# Patient Record
Sex: Male | Born: 1975 | Race: White | Hispanic: No | Marital: Married | Smoking: Current every day smoker
Health system: Southern US, Community
[De-identification: ages and names within clinical notes are randomized; demographics above are authoritative.]

## PROBLEM LIST (undated history)

## (undated) DIAGNOSIS — F419 Anxiety disorder, unspecified: Secondary | ICD-10-CM

## (undated) DIAGNOSIS — I10 Essential (primary) hypertension: Secondary | ICD-10-CM

## (undated) DIAGNOSIS — F32A Depression, unspecified: Secondary | ICD-10-CM

## (undated) DIAGNOSIS — I219 Acute myocardial infarction, unspecified: Secondary | ICD-10-CM

## (undated) DIAGNOSIS — F329 Major depressive disorder, single episode, unspecified: Secondary | ICD-10-CM

## (undated) HISTORY — DX: Anxiety disorder, unspecified: F41.9

## (undated) HISTORY — DX: Depression, unspecified: F32.A

## (undated) HISTORY — DX: Essential (primary) hypertension: I10

## (undated) HISTORY — DX: Major depressive disorder, single episode, unspecified: F32.9

## (undated) HISTORY — PX: OTHER SURGICAL HISTORY: SHX169

---

## 2001-03-22 ENCOUNTER — Emergency Department (HOSPITAL_COMMUNITY): Admission: EM | Admit: 2001-03-22 | Discharge: 2001-03-23 | Payer: Self-pay | Admitting: Emergency Medicine

## 2001-03-23 ENCOUNTER — Encounter: Payer: Self-pay | Admitting: Emergency Medicine

## 2001-03-29 ENCOUNTER — Emergency Department (HOSPITAL_COMMUNITY): Admission: EM | Admit: 2001-03-29 | Discharge: 2001-03-29 | Payer: Self-pay | Admitting: Emergency Medicine

## 2001-10-16 ENCOUNTER — Encounter: Payer: Self-pay | Admitting: Emergency Medicine

## 2001-10-16 ENCOUNTER — Emergency Department (HOSPITAL_COMMUNITY): Admission: EM | Admit: 2001-10-16 | Discharge: 2001-10-16 | Payer: Self-pay | Admitting: Emergency Medicine

## 2001-12-16 ENCOUNTER — Emergency Department (HOSPITAL_COMMUNITY): Admission: EM | Admit: 2001-12-16 | Discharge: 2001-12-16 | Payer: Self-pay | Admitting: Emergency Medicine

## 2001-12-16 ENCOUNTER — Encounter: Payer: Self-pay | Admitting: Emergency Medicine

## 2008-08-16 ENCOUNTER — Ambulatory Visit: Payer: Self-pay | Admitting: Diagnostic Radiology

## 2008-08-16 ENCOUNTER — Emergency Department (HOSPITAL_BASED_OUTPATIENT_CLINIC_OR_DEPARTMENT_OTHER): Admission: EM | Admit: 2008-08-16 | Discharge: 2008-08-16 | Payer: Self-pay | Admitting: Emergency Medicine

## 2008-12-07 ENCOUNTER — Emergency Department (HOSPITAL_COMMUNITY): Admission: EM | Admit: 2008-12-07 | Discharge: 2008-12-07 | Payer: Self-pay | Admitting: Internal Medicine

## 2009-04-08 ENCOUNTER — Emergency Department (HOSPITAL_COMMUNITY): Admission: EM | Admit: 2009-04-08 | Discharge: 2009-04-08 | Payer: Self-pay | Admitting: Emergency Medicine

## 2009-05-01 ENCOUNTER — Emergency Department (HOSPITAL_COMMUNITY): Admission: EM | Admit: 2009-05-01 | Discharge: 2009-05-01 | Payer: Self-pay | Admitting: Emergency Medicine

## 2009-07-26 ENCOUNTER — Emergency Department: Payer: Self-pay

## 2010-07-22 LAB — URINALYSIS, ROUTINE W REFLEX MICROSCOPIC
Bilirubin Urine: NEGATIVE
Glucose, UA: NEGATIVE mg/dL
Ketones, ur: NEGATIVE mg/dL
Protein, ur: NEGATIVE mg/dL

## 2010-07-22 LAB — RAPID URINE DRUG SCREEN, HOSP PERFORMED
Amphetamines: NOT DETECTED
Barbiturates: NOT DETECTED
Benzodiazepines: POSITIVE — AB
Cocaine: NOT DETECTED
Opiates: POSITIVE — AB

## 2010-07-22 LAB — BASIC METABOLIC PANEL
BUN: 8 mg/dL (ref 6–23)
Calcium: 9.3 mg/dL (ref 8.4–10.5)
GFR calc non Af Amer: >60 mL/min (ref >60)
Glucose, Bld: 160 mg/dL — ABNORMAL HIGH (ref 70–99)
Potassium: 3.2 mEq/L — ABNORMAL LOW (ref 3.5–5.1)

## 2010-07-22 LAB — CBC
HCT: 41.4 % (ref 39.0–52.0)
Platelets: 227 10*3/uL (ref 150–400)
RDW: 13.5 % (ref 11.5–15.5)

## 2010-07-22 LAB — DIFFERENTIAL
Basophils Absolute: 0 10*3/uL (ref 0.0–0.1)
Eosinophils Relative: 0 % (ref 0–5)
Lymphocytes Relative: 8 % — ABNORMAL LOW (ref 12–46)

## 2011-03-06 ENCOUNTER — Encounter: Payer: Self-pay | Admitting: Emergency Medicine

## 2011-03-06 ENCOUNTER — Emergency Department (HOSPITAL_COMMUNITY): Payer: Self-pay

## 2011-03-06 ENCOUNTER — Emergency Department (HOSPITAL_COMMUNITY)
Admission: EM | Admit: 2011-03-06 | Discharge: 2011-03-06 | Disposition: A | Discharge status: Home / Self Care | Payer: Self-pay | Attending: Emergency Medicine | Admitting: Emergency Medicine

## 2011-03-06 DIAGNOSIS — F17200 Nicotine dependence, unspecified, uncomplicated: Secondary | ICD-10-CM | POA: Insufficient documentation

## 2011-03-06 DIAGNOSIS — R238 Other skin changes: Secondary | ICD-10-CM | POA: Insufficient documentation

## 2011-03-06 DIAGNOSIS — M25569 Pain in unspecified knee: Secondary | ICD-10-CM | POA: Insufficient documentation

## 2011-03-06 MED ORDER — HYDROCODONE-ACETAMINOPHEN 5-325 MG PO TABS: 1.0000 | ORAL_TABLET | Freq: Four times a day (QID) | ORAL | Status: DC | PRN

## 2011-03-06 MED ORDER — HYDROCODONE-ACETAMINOPHEN 5-325 MG PO TABS
1.0000 | ORAL_TABLET | Freq: Once | ORAL | Status: AC
  Administered 2011-03-06: 1 via ORAL
  Filled 2011-03-06: qty 1

## 2011-03-06 MED ORDER — DICLOFENAC SODIUM 75 MG PO TBEC: 75.0000 mg | DELAYED_RELEASE_TABLET | Freq: Two times a day (BID) | ORAL | Status: DC

## 2011-03-06 NOTE — ED Provider Notes (Signed)
History     CSN: [id number] Arrival date & time: 03/06/2011  5:41 PM   First MD Initiated Contact with Patient 03/06/11 1757     6:35 PM HPI Patient is complaining of left knee pain that began 2 weeks ago. No known injury or prior trauma determined. Reports having bruising and a circular area around the left medial inferior knee tender to palpation. Tender to bending, and walking. No edema, erythema,numbness, tingling or weakness.  Patient is a 35 y.o. male presenting with knee pain.  Knee Pain This is a new problem. Episode onset: 2 weeks ago. The problem occurs constantly. The problem has been gradually worsening. Pertinent negatives include no chills, fever, joint swelling, numbness or weakness. The symptoms are aggravated by walking, standing and bending. He has tried nothing for the symptoms.    History reviewed. No pertinent past medical history.  History reviewed. No pertinent past surgical history.  No family history on file.  History  Substance Use Topics  . Smoking status: Current Everyday Smoker -- 1.0 packs/day  . Smokeless tobacco: Never Used  . Alcohol Use: No      Review of Systems  Constitutional: Negative for fever and chills.  Musculoskeletal: Negative for back pain and joint swelling.       Positive for knee pain  Skin: Positive for color change.  Neurological: Negative for weakness and numbness.  All other systems reviewed and are negative.    Allergies  Review of patient's allergies indicates no known allergies.  Home Medications   Current Outpatient Rx  Name Route Sig Dispense Refill  . IBUPROFEN 200 MG PO TABS Oral Take 600 mg by mouth every 6 (six) hours as needed. Pain.     . ONE-A-DAY MENS PO Oral Take 1 tablet by mouth daily.      Marland Kitchen NAPHAZOLINE HCL 0.012 % OP SOLN Both Eyes Place 1 drop into both eyes 4 (four) times daily. Flush eyes out       BP 145/94  Pulse 102  Temp(Src) 98 F (36.7 C) (Oral)  Resp 20  SpO2 100%  Physical Exam    Constitutional: He is oriented to person, place, and time. He appears well-developed and well-nourished.  HENT:  Head: Normocephalic and atraumatic.  Eyes: Pupils are equal, round, and reactive to light.  Musculoskeletal:       Left knee: He exhibits normal range of motion, no swelling, no erythema and no bony tenderness. tenderness found. Medial joint line tenderness noted.       Is ecchymosis of the medial left knee. Appears to be a bite wound however patient denies having a bite wound. Tender to palpation. Full range of motion, strength, sensation distally. Normal pulses distally.  Neurological: He is alert and oriented to person, place, and time.  Skin: Skin is warm and dry. No rash noted. No erythema. No pallor.  Psychiatric: He has a normal mood and affect. His behavior is normal.    ED Course  Procedures  MDM          Thomasene Lot, [place] 03/07/11 0114

## 2011-03-06 NOTE — ED Notes (Signed)
Has a round painful bruised area to the inner part of his left knee. Has been there x 2 weeks. No known injury.

## 2011-03-07 ENCOUNTER — Ambulatory Visit (HOSPITAL_COMMUNITY): Payer: Self-pay | Attending: Emergency Medicine

## 2011-03-07 NOTE — ED Provider Notes (Signed)
Medical screening examination/treatment/procedure(s) were performed by non-physician practitioner and as supervising physician I was immediately available for consultation/collaboration.  Damare Serano Smitty Cords, MD 03/07/11 1600

## 2011-03-10 ENCOUNTER — Emergency Department (HOSPITAL_COMMUNITY): Payer: Self-pay

## 2011-03-10 ENCOUNTER — Emergency Department (HOSPITAL_COMMUNITY)
Admission: EM | Admit: 2011-03-10 | Discharge: 2011-03-10 | Disposition: A | Discharge status: Home / Self Care | Payer: Self-pay | Attending: Emergency Medicine | Admitting: Emergency Medicine

## 2011-03-10 ENCOUNTER — Encounter (HOSPITAL_COMMUNITY): Payer: Self-pay | Admitting: Emergency Medicine

## 2011-03-10 DIAGNOSIS — M25519 Pain in unspecified shoulder: Secondary | ICD-10-CM | POA: Insufficient documentation

## 2011-03-10 DIAGNOSIS — Z79899 Other long term (current) drug therapy: Secondary | ICD-10-CM | POA: Insufficient documentation

## 2011-03-10 DIAGNOSIS — S0990XA Unspecified injury of head, initial encounter: Secondary | ICD-10-CM | POA: Insufficient documentation

## 2011-03-10 DIAGNOSIS — S40019A Contusion of unspecified shoulder, initial encounter: Secondary | ICD-10-CM

## 2011-03-10 DIAGNOSIS — F17200 Nicotine dependence, unspecified, uncomplicated: Secondary | ICD-10-CM | POA: Insufficient documentation

## 2011-03-10 DIAGNOSIS — M542 Cervicalgia: Secondary | ICD-10-CM | POA: Insufficient documentation

## 2011-03-10 DIAGNOSIS — W19XXXA Unspecified fall, initial encounter: Secondary | ICD-10-CM

## 2011-03-10 DIAGNOSIS — S161XXA Strain of muscle, fascia and tendon at neck level, initial encounter: Secondary | ICD-10-CM

## 2011-03-10 DIAGNOSIS — Y998 Other external cause status: Secondary | ICD-10-CM | POA: Insufficient documentation

## 2011-03-10 DIAGNOSIS — W108XXA Fall (on) (from) other stairs and steps, initial encounter: Secondary | ICD-10-CM | POA: Insufficient documentation

## 2011-03-10 DIAGNOSIS — Y92009 Unspecified place in unspecified non-institutional (private) residence as the place of occurrence of the external cause: Secondary | ICD-10-CM | POA: Insufficient documentation

## 2011-03-10 MED ORDER — HYDROCODONE-ACETAMINOPHEN 5-500 MG PO TABS: 1.0000 | ORAL_TABLET | Freq: Four times a day (QID) | ORAL | Status: AC | PRN
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
## 2011-03-10 NOTE — ED Notes (Signed)
Patient is alert and oriented x3.  He has been given DC instructions with following instructions He has given verbal confirmation V/S stable.  He is not showing any signs of distress at this time.
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
## 2011-10-11 ENCOUNTER — Emergency Department (HOSPITAL_COMMUNITY)
Admission: EM | Admit: 2011-10-11 | Discharge: 2011-10-12 | Disposition: A | Discharge status: Home / Self Care | Payer: Self-pay | Attending: Emergency Medicine | Admitting: Emergency Medicine

## 2011-10-11 DIAGNOSIS — W11XXXA Fall on and from ladder, initial encounter: Secondary | ICD-10-CM | POA: Insufficient documentation

## 2011-10-11 DIAGNOSIS — S86919A Strain of unspecified muscle(s) and tendon(s) at lower leg level, unspecified leg, initial encounter: Secondary | ICD-10-CM

## 2011-10-11 DIAGNOSIS — S8390XA Sprain of unspecified site of unspecified knee, initial encounter: Secondary | ICD-10-CM

## 2011-10-11 DIAGNOSIS — F17200 Nicotine dependence, unspecified, uncomplicated: Secondary | ICD-10-CM | POA: Insufficient documentation

## 2011-10-11 DIAGNOSIS — IMO0002 Reserved for concepts with insufficient information to code with codable children: Secondary | ICD-10-CM | POA: Insufficient documentation

## 2011-10-11 DIAGNOSIS — Y9239 Other specified sports and athletic area as the place of occurrence of the external cause: Secondary | ICD-10-CM | POA: Insufficient documentation

## 2011-10-12 ENCOUNTER — Encounter (HOSPITAL_COMMUNITY): Payer: Self-pay | Admitting: *Deleted

## 2011-10-12 ENCOUNTER — Emergency Department (HOSPITAL_COMMUNITY): Payer: Self-pay

## 2011-10-12 MED ORDER — OXYCODONE-ACETAMINOPHEN 5-325 MG PO TABS
2.0000 | ORAL_TABLET | Freq: Once | ORAL | Status: AC
  Administered 2011-10-12: 2 via ORAL
  Filled 2011-10-12: qty 2

## 2011-10-12 MED ORDER — OXYCODONE-ACETAMINOPHEN 5-325 MG PO TABS: 1.0000 | ORAL_TABLET | Freq: Four times a day (QID) | ORAL | Status: AC | PRN

## 2011-10-12 NOTE — ED Provider Notes (Signed)
History     CSN: [id number]  Arrival date & time 10/11/11  2352   First MD Initiated Contact with Patient 10/12/11 0125      Chief Complaint  Patient presents with  . Knee Pain   HPI  History provided by the patient. Patient is a 36 year old male with no significant past medical history presents with complaints of right knee pain injury. Patient states that he was climbing out of the elevated pool and going backwards down the lumen on ladder when the ladder broke causing him to fall she on his legs and twist his right knee. Patient complains of severe pain to the medial and posterior aspects of right knee. Pain is worse with any movements or palpation. Patient is not use any treatment for his symptoms. Accident occurred to 3 hours prior to arrival. Patient denies any other aggravating or alleviating factors. Patient denies any other associated symptoms. He denies any other injury from a fall, no LOC. Patient denies any numbness or weakness to his foot and toes. Patient denies any deformity.      History reviewed. No pertinent past medical history.  Past Surgical History  Procedure Date  . Carpal tunnel release surgery     No family history on file.  History  Substance Use Topics  . Smoking status: Current Everyday Smoker -- 1.0 packs/day  . Smokeless tobacco: Never Used  . Alcohol Use: No      Review of Systems  HENT: Negative for neck pain.   Musculoskeletal: Positive for joint swelling. Negative for back pain.       Knee pain  Skin: Negative for color change and rash.  Neurological: Negative for dizziness, light-headedness and headaches.    Allergies  Review of patient's allergies indicates no known allergies.  Home Medications   Current Outpatient Rx  Name Route Sig Dispense Refill  . ADULT MULTIVITAMIN W/MINERALS CH Oral Take 1 tablet by mouth daily.      BP 163/115  Pulse 116  Temp 98.6 F (37 C) (Oral)  Resp 20  SpO2 100%  Physical Exam  Nursing  note and vitals reviewed. Constitutional: He is oriented to person, place, and time. He appears well-developed and well-nourished. No distress.  HENT:  Head: Normocephalic.  Cardiovascular: Normal rate and regular rhythm.   Pulmonary/Chest: Effort normal and breath sounds normal.  Musculoskeletal:       No deformity of the knee. No significant swelling.  Tenderness to palpation over the medial aspect of right knee. this is greatest over the semitendinosus and Gracilis tendon area. Remainder of exam is limited secondary to pt pain and comfort.   Normal distal pedal pulses and sensation in foot.  Neurological: He is alert and oriented to person, place, and time.  Skin: Skin is warm. No erythema.  Psychiatric: He has a normal mood and affect.    ED Course  Procedures   Dg Knee Complete 4 Views Right  10/12/2011  *RADIOLOGY REPORT*  Clinical Data: Knee pain after jamming injury.  RIGHT KNEE - COMPLETE 4+ VIEW  Comparison: None.  Findings: The right knee appears intact. No evidence of acute fracture or subluxation.  No focal bone lesions.  Bone matrix and cortex appear intact.  No abnormal radiopaque densities in the soft tissues.  No significant effusion.  IMPRESSION: No acute bony abnormalities.  Original Report Authenticated By: Marlon Pel, M.D.     1. Knee sprain   2. Muscle strain of knee  MDM  2:00 AM patient seen and evaluated. Patient no acute distress.   X-rays unremarkable. Patient placed in knee immobilizer and provided crutches. Patient also given Percocets with some improvement of pain. Will give prescriptions for pain and orthopedic referral.     Angus Seller, PA 10/12/11 [phone]

## 2011-10-12 NOTE — Discharge Instructions (Signed)
You were seen and evaluated today for your complaints of right knee pain and injury. Your x-rays today do not show any signs of broken bones or dislocations. Your providers today are concerned for tendon and ligament injury to knee. You've been placed in a brace to help rest or leg and help with healing. You have also been given crutches to use to keep weight off of your leg. Please followup with orthopedic specialist for continued evaluation and treatment of your symptoms. Use rest, ice, compression and elevation to reduce pain and swelling.    Knee Sprain You have a knee sprain. Sprains are painful injuries to the joints. A sprain is a partial or complete tearing of ligaments. Ligaments are tough, fibrous tissues that hold bones together at the joints. A strain (sprain) has occurred when a ligament is stretched or damaged. This injury may take several weeks to heal. This is often the same length of time as a bone fracture (break in bone) takes to heal. Even though a fracture (bone break) may not have occurred, the recovery times may be similar. HOME CARE INSTRUCTIONS   Rest the injured area for as long as directed by your caregiver. Then slowly start using the joint as directed by your caregiver and as the pain allows. Use crutches as directed. If the knee was splinted or casted, continue use and care as directed. If an ace bandage has been applied today, it should be removed and reapplied every 3 to 4 hours. It should not be applied tightly, but firmly enough to keep swelling down. Watch toes and feet for swelling, bluish discoloration, coldness, numbness or excessive pain. If any of these symptoms occur, remove the ace bandage and reapply more loosely.If these symptoms persist, seek medical attention.   For the first 24 hours, lie down. Keep the injured extremity elevated on two pillows.   Apply ice to the injured area for 15 to 20 minutes every couple hours. Repeat this 3 to 4 times per day for the  first 48 hours. Put the ice in a plastic bag and place a towel between the bag of ice and your skin.   Wear any splinting, casting, or elastic bandage applications as instructed.   Only take over-the-counter or prescription medicines for pain, discomfort, or fever as directed by your caregiver. Do not use aspirin immediately after the injury unless instructed by your caregiver. Aspirin can cause increased bleeding and bruising of the tissues.   If you were given crutches, continue to use them as instructed. Do not resume weight bearing on the affected extremity until instructed.  Persistent pain and inability to use the injured area as directed for more than 2 to 3 days are warning signs. If this happens you should see a caregiver for a follow-up visit as soon as possible. Initially, a hairline fracture (this is the same as a broken bone) may not be evident on x-rays. Persistent pain and swelling indicate that further evaluation, non-weight bearing (use of crutches as instructed), and/or further x-rays are indicated. X-rays may sometimes not show a small fracture until a week or ten days later. Make a follow-up appointment with your own caregiver or one to whom we have referred you. A radiologist (specialist in reading x-rays) may re-read your X-rays. Make sure you know how you are to get your x-ray results. Do not assume everything is normal if you do not hear from [place]. SEEK MEDICAL CARE IF:   Bruising, swelling, or pain increases.  You have cold or numb toes   You have continuing difficulty or pain with walking.  SEEK IMMEDIATE MEDICAL CARE IF:   Your toes are cold, numb or blue.   The pain is not responding to medications and continues to stay the same or get worse.  MAKE SURE YOU:   Understand these instructions.   Will watch your condition.   Will get help right away if you are not doing well or get worse.  Document Released: 04/02/2005 Document Revised: 03/22/2011 Document Reviewed:  03/17/2007 [facility] Patient Information 2012 [place], [place].   RESOURCE GUIDE  Chronic Pain Problems: Contact [facility]  [phone] Patients need to be referred by their primary care doctor.  Insufficient Money for Medicine: Contact United Way:  call "211" or [facility] 504-392-2350.  No Primary Care Doctor: - Call [facility]  [phone] - can help you locate a primary care doctor that  accepts your insurance, provides certain services, etc. - Physician Referral Service- [phone]  Agencies that provide inexpensive medical care: - [facility]  [phone] - [facility]  [phone] - [facility]  [phone] - Women's Clinic  [phone] - Planned Parenthood  [phone] [facility]  [phone]  Medicaid-accepting [facility] Providers: - [facility]- [address]  [phone], Mon-Fri 9am-7pm, Sat 9am-1pm - [facility]- [address] [place], [address] [place]  [phone] - [facility]- [address] [place]  [phone] [facility]- [address]  [phone] - Renaye Rakers- [address] [place], [address], [phone]  Only accepts [place] patients after they have their name  applied to their card  Self Pay (no insurance) in [place]: - Sickle Cell Patients: Dr Willey Blade, [facility]  [address] [place], [phone] - [facility]- [address] [place]  [phone]       [facility]- [address]       -     [facility]- see information above (Speak to [employer] if you do not have insurance)       -  [facility]- [address] [place], [phone]       -  [facility]- [address],  [phone]       -  [facility]- [address] [phone]       -  Dr Julio Sicks-  [address]  [address], [place], [phone]       -  [facility]- [address], [phone]       -  [facility]- [address] [phone], also [address] [phone]       -    [facility]- [address] [place], [phone], 1st & 3rd Saturday   every month, 10am-1pm  1) Find a Doctor and Pay Out of Pocket Although you won't have to find out who is covered by your insurance plan, it is a good idea to ask around and get recommendations. You will then need to call the office and see if the doctor you have chosen will accept you as a new patient and what types of options they offer for patients who are self-pay. Some doctors offer discounts or will  set up payment plans for their patients who do not have insurance, but you will need to ask so you aren't surprised when you get to your appointment.  2) Contact Your Local Health Department Not all health departments have doctors that can see patients for sick visits, but many do, so it is worth a call to see if yours does. If you don't know where your local health department is, you can check in your phone book. The CDC also has a tool to help you locate your state's health department, and many state websites also have listings of all of their local health departments.  3) Find a Walk-in Clinic If your illness is not likely to be very severe or complicated, you may want to try a walk in clinic. These are popping up all over the country in pharmacies, drugstores, and shopping centers. They're usually staffed by nurse practitioners or physician assistants that have been trained to treat common illnesses and complaints. They're usually fairly quick and inexpensive. However, if you have serious medical issues or chronic medical problems, these are probably not your best option  STD Testing - [facility] Department of [facility], STD Clinic, [address], [place], phone [phone] or [phone].   Monday - Friday, call for an appointment. [facility] Department of [employer], STD Clinic, [place] [address], [address], phone [phone] or [phone].  Monday - Friday, call for an appointment.  Abuse/Neglect: [facility] Child Abuse Hotline (801)835-3087 [facility] Child Abuse Hotline 630-462-7200 (After Hours)  Emergency Shelter:  [facility] 314-553-8679  Maternity Homes: - [facility] (859) [phone] - [facility] [phone]  MRSA Hotline #:   276-631-6260  [facility] Resources  [facility] [address]  [employer] [facility] Dept. [address]                 [address]         [address] [place] [address]  Blondell Reveal Phone:  [phone]                                  Phone:  [phone]                   Phone:  [phone]  [facility] Mental Health, [phone] - [phone]       -     [facility] in [place], [address]                                  ([phone], Insurance  [place] Child Abuse Hotline 9808033750 or 503-741-6269 (After Hours)   Behavioral Health Services  Substance Abuse Resources: - Alcohol and Drug Services  (612)430-8162 - [facility] 8386630310 - [facility] [place] [phone] Floydene Flock [phone] - Residential & Outpatient Substance Abuse Program  [phone]  Psychological Services: [facility]  (612) [phone] Services  [phone] - [facility] [place], [phone] [place]. [address] [place], ACCESS LINE: 604-375-2220 or (254)[phone], [employer]  Dental Assistance  If unable to pay or uninsured, contact:  [facility] or [facility]. to become qualified for the adult dental clinic.  Patients with Medicaid: [facility] [phone] [address], [phone] [address], [phone]  If unable to pay, or uninsured, contact [facility] 978-049-4040) or [facility] Department ([phone] in [place], [phone] in [facility]) to become qualified for the adult dental clinic  Other Low-Cost Community Dental Services: - [facility]- [address] [place], [place], [place], [address], [phone], [address], 2nd and 4th Thursday of the month at 6:30am.  10 clients each day by appointment, can sometimes see walk-in patients if someone does not show for an appointment. [facility]- [address] Ether Griffins [place], [place], [address], [phone] - [facility]- [address] [place], [place], [address], [phone] - [place] [facility]- [phone] [facility] Health Department- [phone] [facility] Department- [phone]

## 2011-10-12 NOTE — ED Notes (Signed)
Pt states he slip down a latter and hit his right knee. Pt denies any loc.no dislocation noted

## 2011-10-13 NOTE — ED Provider Notes (Signed)
Medical screening examination/treatment/procedure(s) were performed by non-physician practitioner and as supervising physician I was immediately available for consultation/collaboration.   Inda Mcglothen E Lidwina Kaner, MD 10/13/11 1629 

## 2011-10-16 ENCOUNTER — Emergency Department: Payer: Self-pay | Admitting: Emergency Medicine

## 2012-05-17 DIAGNOSIS — I219 Acute myocardial infarction, unspecified: Secondary | ICD-10-CM

## 2012-05-17 HISTORY — DX: Acute myocardial infarction, unspecified: I21.9

## 2012-06-10 ENCOUNTER — Emergency Department: Payer: Self-pay | Admitting: Emergency Medicine

## 2012-06-13 ENCOUNTER — Observation Stay: Payer: Self-pay | Admitting: Student

## 2012-06-13 LAB — CBC
MCH: 30.6 pg (ref 26.0–34.0)
MCHC: 33.5 g/dL (ref 32.0–36.0)
MCV: 91 fL (ref 80–100)
RBC: 5.02 10*6/uL (ref 4.40–5.90)
RDW: 13.9 % (ref 11.5–14.5)
WBC: 15.9 10*3/uL — ABNORMAL HIGH (ref 3.8–10.6)

## 2012-06-13 LAB — COMPREHENSIVE METABOLIC PANEL
Alkaline Phosphatase: 101 U/L (ref 50–136)
BUN: 11 mg/dL (ref 7–18)
Bilirubin,Total: 0.3 mg/dL (ref 0.2–1.0)
Calcium, Total: 8.8 mg/dL (ref 8.5–10.1)
Chloride: 105 mmol/L (ref 98–107)
EGFR (Non-African Amer.): >60
Osmolality: 276 (ref 275–301)
Potassium: 4.2 mmol/L (ref 3.5–5.1)
SGPT (ALT): 27 U/L (ref 12–78)
Total Protein: 7.4 g/dL (ref 6.4–8.2)

## 2012-06-13 LAB — CK TOTAL AND CKMB (NOT AT ARMC)
CK, Total: 183 U/L (ref 35–232)
CK-MB: 3.5 ng/mL (ref 0.5–3.6)

## 2012-06-13 LAB — TROPONIN I
Troponin-I: 0.03 ng/mL
Troponin-I: 0.04 ng/mL
Troponin-I: <0.02 ng/mL

## 2012-06-13 LAB — ETHANOL: Ethanol: <3 mg/dL

## 2012-07-13 ENCOUNTER — Emergency Department: Payer: Self-pay | Admitting: Emergency Medicine

## 2012-07-13 LAB — BASIC METABOLIC PANEL
Anion Gap: 4 — ABNORMAL LOW (ref 7–16)
BUN: 12 mg/dL (ref 7–18)
Chloride: 105 mmol/L (ref 98–107)
Creatinine: 0.86 mg/dL (ref 0.60–1.30)
Glucose: 107 mg/dL — ABNORMAL HIGH (ref 65–99)
Sodium: 138 mmol/L (ref 136–145)

## 2012-07-13 LAB — CBC
MCH: 30.7 pg (ref 26.0–34.0)
MCHC: 33.8 g/dL (ref 32.0–36.0)
MCV: 91 fL (ref 80–100)
Platelet: 265 10*3/uL (ref 150–440)
RDW: 13.7 % (ref 11.5–14.5)

## 2012-07-13 LAB — TROPONIN I: Troponin-I: <0.02 ng/mL

## 2012-09-09 ENCOUNTER — Emergency Department (HOSPITAL_COMMUNITY)
Admission: EM | Admit: 2012-09-09 | Discharge: 2012-09-10 | Discharge status: Against Medical Advice | Payer: Self-pay | Attending: Emergency Medicine | Admitting: Emergency Medicine

## 2012-09-09 ENCOUNTER — Encounter (HOSPITAL_COMMUNITY): Payer: Self-pay | Admitting: Emergency Medicine

## 2012-09-09 DIAGNOSIS — I252 Old myocardial infarction: Secondary | ICD-10-CM | POA: Insufficient documentation

## 2012-09-09 DIAGNOSIS — R45851 Suicidal ideations: Secondary | ICD-10-CM | POA: Insufficient documentation

## 2012-09-09 DIAGNOSIS — I10 Essential (primary) hypertension: Secondary | ICD-10-CM | POA: Insufficient documentation

## 2012-09-09 DIAGNOSIS — F3289 Other specified depressive episodes: Secondary | ICD-10-CM | POA: Insufficient documentation

## 2012-09-09 DIAGNOSIS — G4700 Insomnia, unspecified: Secondary | ICD-10-CM | POA: Insufficient documentation

## 2012-09-09 DIAGNOSIS — F17200 Nicotine dependence, unspecified, uncomplicated: Secondary | ICD-10-CM | POA: Insufficient documentation

## 2012-09-09 DIAGNOSIS — F329 Major depressive disorder, single episode, unspecified: Secondary | ICD-10-CM | POA: Insufficient documentation

## 2012-09-09 HISTORY — DX: Acute myocardial infarction, unspecified: I21.9

## 2012-09-09 NOTE — ED Notes (Addendum)
Pt noted not being in room by phlebotomy.  Phlebotomy asked this RN when she stepped out of another pt's room if there was a pt that was suppose to be in that room.  RN stated there was.  RN poked her head into room and saw that pt that was brought back by previous RN with mother was not in the room.  This RN attempted to find pt in the waiting area and went out to the bus stop in front of the hospital attempting to find pt with no success. Pt was voluntary and had expressed to triage RN that he was not sure if he wanted to stay or not. Charge RN notified of pt's elopement.

## 2012-09-09 NOTE — ED Notes (Addendum)
Endorses SI, NO HI. Depression, insomnia, lost job few months back, MI on June 13, 2012. SI without plan or specific intent. States" I think everyone would be better off without me around", "I feel so useless" MI back in February without PCI. Stress test. NO CP, SOB

## 2012-09-09 NOTE — ED Notes (Signed)
AC Crystal notified of sitter need. Security notified

## 2012-09-09 NOTE — ED Notes (Signed)
Patient in triage room with Mother at bedside. Patient expressing that he is not sure that he wants to go through with the Pysch workup "I dont want to get locked up in a rubber room". Discussed with patient the process for SI workup. Patient in agreeance.

## 2012-09-10 ENCOUNTER — Ambulatory Visit: Payer: Self-pay | Admitting: Family Medicine

## 2012-09-10 VITALS — BP 114/68 | HR 90 | Temp 98.0°F | Resp 16 | Ht 62.5 in | Wt 124.0 lb

## 2012-09-10 DIAGNOSIS — F329 Major depressive disorder, single episode, unspecified: Secondary | ICD-10-CM

## 2012-09-10 DIAGNOSIS — F3289 Other specified depressive episodes: Secondary | ICD-10-CM

## 2012-09-10 DIAGNOSIS — G4700 Insomnia, unspecified: Secondary | ICD-10-CM

## 2012-09-10 MED ORDER — CLONAZEPAM 0.5 MG PO TABS: ORAL_TABLET | ORAL | Status: DC

## 2012-09-10 MED ORDER — BUPROPION HCL ER (SR) 150 MG PO TB12: 150.0000 mg | ORAL_TABLET | Freq: Two times a day (BID) | ORAL | Status: DC

## 2012-09-10 NOTE — Patient Instructions (Addendum)
Start the wellbutrin at one pill a day. After 3 days you can increase to twice a day Use the clonazepam as needed for sleep- remember it can cause sedation, so do not use if you need to drive, and do not combine with alcohol.  Remember that occasionally we see suicidal ideation with starting antidepressant use.  If you have this sort of problem please seek help right away.  If you develop any chest pains, SOB, or palpitations please get help right away    Please continue your aspirin regimen and work on quitting smoking.  Let me know when you are ready for me to refer you to a heart doctor.    Please call me in about 2 weeks with an update.

## 2012-09-10 NOTE — Progress Notes (Signed)
[facility] [address] [place] [address] [phone]  Date:  09/10/2012   Name:  Barry Cherry.   DOB:  10/20/1975   MRN:  [id number]  PCP:  Evelene Croon, MD    Chief Complaint: Depression and Insomnia   History of Present Illness:  Barry Cherry. is a 37 y.o. very pleasant male patient who presents with the following:  He was at the ED last night, but left without being seen. He decided to come here instead.   He is here today with depression, insomnia.  He is now at the point that he would like to be on treatment for this.  He had an MI in February of this year- this was treated at [facility].  He had elevated enzymes and "failed my stress test,"  He did not have a cardiac cath or any other intervention.  He was allowed to go home after a couple of days with plan for outpt follow-up with cardiology, but he did not follow-up due to lack of insurance.  He is taking aspirin daily, but continues to smoke about 1/2 ppd. He states he was not sent home on any medications except aspirin.     He has noted CP on a couple of occasions since his MI, but nothing like what brought him to the hospital.  These more recent episodes lasted maybe 10 minutes, and he has not had any pain in about 2 months.  No SOB, no palpitations,  He is walking about one mile a day without any CP  He states that since his MI he has felt depressed, felt worthless, been crying a lot. He states he has not had depression in the past, never been tx for depression on anxiety.  He is having a hard time getting to sleep, he has nightmares and might wake up "crying and screaming."  He is only sleeping a few hours a night.  He is eating ok.   He has a wife, 3 children ages 41, 53, and 65 years old.   He is not working right now- he hopes he is about to get a new job however, and is happy about this.  He works in Psychologist, occupational.    He does not have any active SI or HI. States he wants to be  there for his kids and would not hurt himself.    He is otherwise generally healthy  No drugs, drinks etoh on occasion only a few times a year  There are no active problems to display for this patient.   Past Medical History  Diagnosis Date  . MI (myocardial infarction) Feb 2014  . Depression   . Anxiety   . Hypertension     Past Surgical History  Procedure Laterality Date  . Carpal tunnel release surgery      History  Substance Use Topics  . Smoking status: Current Every Day Smoker -- 1.00 packs/day for 20 years    Types: Cigarettes  . Smokeless tobacco: Never Used  . Alcohol Use: No    History reviewed. No pertinent family history.  Allergies  Allergen Reactions  . Penicillins Anaphylaxis  . Tramadol Other (See Comments)    Urinary Sx    Medication list has been reviewed and updated.  Current Outpatient Prescriptions on File Prior to Visit  Medication Sig Dispense Refill  . aspirin 81 MG tablet Take 81 mg by mouth daily.      . B Complex Vitamins (  VITAMIN B COMPLEX PO) Take 1 tablet by mouth daily.       No current facility-administered medications on file prior to visit.    Review of Systems:  As per HPI- otherwise negative.   Physical Examination: Filed Vitals:   09/10/12 0933  BP: 114/68  Pulse: 111  Temp: 98 F (36.7 C)  Resp: 16   Filed Vitals:   09/10/12 0933  Height: 5' 2.5" (1.588 m)  Weight: 124 lb (56.246 kg)   Body mass index is 22.3 kg/(m^2). Ideal Body Weight: Weight in (lb) to have BMI = 25: 138.6  GEN: WDWN, NAD, Non-toxic, A & O x 3, looks well, appropriate HEENT: Atraumatic, Normocephalic. Neck supple. No masses, No LAD. Ears and Nose: No external deformity. CV: RRR, No M/G/R. No JVD. No thrill. No extra heart sounds. PULM: CTA B, no wheezes, crackles, rhonchi. No retractions. No resp. distress. No accessory muscle use. EXTR: No c/c/e NEURO Normal gait.  PSYCH: Normally interactive. Conversant. Not depressed or anxious  appearing.  Calm demeanor.   Recommended an EKG today but he declined due to financial reasons Assessment and Plan: Depression - Plan: buPROPion (WELLBUTRIN SR) 150 MG 12 hr tablet  Insomnia - Plan: clonazePAM (KLONOPIN) 0.5 MG tablet  Barry Cherry is here today with depression, anxiety, insomnia, tobacco abuse and recent MI.  Discussed with pharmD- as his MI was several months ago should have minimal risk with starting wellbutrin.  Will start wellbutrin BID- also hope this will help him to quit smoking.   Low dose of clonazepam to use before bed to help with sleep.  Cautioned regarding sedation As soon as he has insurance in place (he hopes will be in the next few weeks) he will let me know and I will refer him to cardiology for follow-up.  Requested his records from [facility] today    Instructed to try a 1/2 clonazepam first to see if this is adequate.    See patient instructions for more details.    Signed Abbe Amsterdam, MD

## 2012-09-13 ENCOUNTER — Telehealth: Payer: Self-pay

## 2012-09-13 NOTE — Telephone Encounter (Signed)
Patient says we prescribed him wellbutrin yesterday and that it "made him start seeing things and that he couldn't take it any more."  Can we call him in something different?  [phone]

## 2012-09-13 NOTE — Telephone Encounter (Signed)
It looks like there was miscommunication-the AVS states that if he develops SOB, to seek help right away.  The stumbling and SOB are concerning.  Please advise the patient to seek care either here or in the emergency department.

## 2012-09-13 NOTE — Telephone Encounter (Signed)
Left message to return call 

## 2012-09-13 NOTE — Telephone Encounter (Signed)
Patient's wife called back, per Chelle, I relayed message to seek care either here or ED. They went to ED where they were told the only thing they could do for him was admit him for psych treatment which patient did not want to do. SOB is no longer an issue since he stopped taking the medication (last dosage was taken last night and symptoms have subsided significantly). Advised to return to clinic tomorrow morning. They are wondering if anything else can be prescribed in substitute without being seen.

## 2012-09-13 NOTE — Telephone Encounter (Signed)
Patients wife notified and voiced understanding that he would need to RTC

## 2012-09-13 NOTE — Telephone Encounter (Signed)
pts wife called stating that pts anti-depressant is causing SOB, stumbling, no appetite, and insomnia. Cautioned her that with SOB pt should be evaluated. States they dont have the money for that and their visit summary states they can just call and ask for a different medication 'with those exact side effects'.   Please call pt: [phone] (only number working for them)  Cautioned pts wife if sob worsens before call back to please seek medical attention.   bf

## 2012-09-14 ENCOUNTER — Ambulatory Visit: Payer: Self-pay | Admitting: Physician Assistant

## 2012-09-14 VITALS — BP 144/92 | HR 94 | Temp 98.0°F | Resp 18 | Ht 63.0 in | Wt 120.0 lb

## 2012-09-14 DIAGNOSIS — I2510 Atherosclerotic heart disease of native coronary artery without angina pectoris: Secondary | ICD-10-CM | POA: Insufficient documentation

## 2012-09-14 DIAGNOSIS — T50905A Adverse effect of unspecified drugs, medicaments and biological substances, initial encounter: Secondary | ICD-10-CM

## 2012-09-14 DIAGNOSIS — F17200 Nicotine dependence, unspecified, uncomplicated: Secondary | ICD-10-CM

## 2012-09-14 DIAGNOSIS — F411 Generalized anxiety disorder: Secondary | ICD-10-CM

## 2012-09-14 DIAGNOSIS — I10 Essential (primary) hypertension: Secondary | ICD-10-CM | POA: Insufficient documentation

## 2012-09-14 DIAGNOSIS — F419 Anxiety disorder, unspecified: Secondary | ICD-10-CM

## 2012-09-14 DIAGNOSIS — G4700 Insomnia, unspecified: Secondary | ICD-10-CM

## 2012-09-14 DIAGNOSIS — F329 Major depressive disorder, single episode, unspecified: Secondary | ICD-10-CM

## 2012-09-14 DIAGNOSIS — T887XXA Unspecified adverse effect of drug or medicament, initial encounter: Secondary | ICD-10-CM

## 2012-09-14 DIAGNOSIS — Z720 Tobacco use: Secondary | ICD-10-CM | POA: Insufficient documentation

## 2012-09-14 LAB — POCT CBC
HCT, POC: 48.7 % (ref 43.5–53.7)
Hemoglobin: 15.8 g/dL (ref 14.1–18.1)
Lymph, poc: 2.4 (ref 0.6–3.4)
MCHC: 32.4 g/dL (ref 31.8–35.4)
POC Granulocyte: 8.4 — AB (ref 2–6.9)
RBC: 5.03 M/uL (ref 4.69–6.13)

## 2012-09-14 LAB — LIPID PANEL
Cholesterol: 172 mg/dL (ref 0–200)
HDL: 38 mg/dL — ABNORMAL LOW (ref >39)
Total CHOL/HDL Ratio: 4.5 Ratio
Triglycerides: 84 mg/dL (ref <150)

## 2012-09-14 LAB — COMPREHENSIVE METABOLIC PANEL
Alkaline Phosphatase: 84 U/L (ref 39–117)
BUN: 13 mg/dL (ref 6–23)
Creat: 0.99 mg/dL (ref 0.50–1.35)
Glucose, Bld: 104 mg/dL — ABNORMAL HIGH (ref 70–99)
Sodium: 141 mEq/L (ref 135–145)
Total Bilirubin: 0.7 mg/dL (ref 0.3–1.2)

## 2012-09-14 LAB — TSH: TSH: 0.53 u[IU]/mL (ref 0.350–4.500)

## 2012-09-14 MED ORDER — CLONAZEPAM 0.5 MG PO TABS: ORAL_TABLET | ORAL | Status: DC

## 2012-09-14 MED ORDER — SERTRALINE HCL 50 MG PO TABS: 50.0000 mg | ORAL_TABLET | Freq: Every day | ORAL | Status: DC

## 2012-09-14 NOTE — Progress Notes (Signed)
Subjective:    Patient ID: Barry Cherry., male    DOB: 1975-11-14, 37 y.o.   MRN: [id number]  HPI This 37 y.o. male presents for evaluation of adverse reaction to medication.  Last week, was started on Wellbutrin SR 150 mg BID for treatment of anxiety and depression, insomnia, with hopes it would also help him quit smoking.  AMI in February. Notes from his visit here last week reviewed.  He called yesterday reporting his symptoms, and was advised to RTC or go to the ED for evaluation.  He actually had gone to the ED last week with his original symptoms, but left when they planned drug screening and psych admission.    Can't sleep "Take one step forward and two steps back" Felt like I was dreaming, talking, but I was awake "Almost seeing things" "I think I lost 3 days of my life, and I'm really pissed off" No appetite SOB "he was like a zombie" says his mother  Symptoms began after the first dose, but were really noticeable on the second day.  D/C'd the medication. Still can't sleep, still feels anxious and depressed, but the other new symptoms are resolved, or almost resolved.  Lots of crying spells.  Wife flushed the remaining tablets, AND the clonazepam.  Review of Systems As above. No URI-type symptoms.  No urinary symptoms.    Objective:   Physical Exam Blood pressure 144/92, pulse 94, temperature 98 F (36.7 C), temperature source Oral, resp. rate 18, height 5\' 3"  (1.6 m), weight 120 lb (54.432 kg), SpO2 100.00%. Body mass index is 21.26 kg/(m^2). Well-developed, well nourished WM who is awake, alert and oriented, in NAD. Accompanied by his mother. HEENT: [facility]/AT, PERRL, EOMI.  Sclera and conjunctiva are clear.  EAC are patent, TMs are normal in appearance. Nasal mucosa is pink and moist. OP is clear. Neck: supple, non-tender, no lymphadenopathy, thyromegaly. Heart: RRR, no murmur Lungs: normal effort, CTA Extremities: no cyanosis, clubbing or edema. Skin: warm and dry  without rash. Psychologic: good mood and appropriate affect, normal speech and behavior.   Results for orders placed in visit on 09/14/12  POCT CBC      Result Value Range   WBC 11.6 (*) 4.6 - 10.2 K/uL   Lymph, poc 2.4  0.6 - 3.4   POC LYMPH PERCENT 20.8  10 - 50 %L   MID (cbc) 0.8  0 - 0.9   POC MID % 6.8  0 - 12 %M   POC Granulocyte 8.4 (*) 2 - 6.9   Granulocyte percent 72.4  37 - 80 %G   RBC 5.03  4.69 - 6.13 M/uL   Hemoglobin 15.8  14.1 - 18.1 g/dL   HCT, POC 11.9  14.7 - 53.7 %   MCV 96.9  80 - 97 fL   MCH, POC 31.4 (*) 27 - 31.2 pg   MCHC 32.4  31.8 - 35.4 g/dL   RDW, POC 82.9     Platelet Count, POC 279  142 - 424 K/uL   MPV 10.8  0 - 99.8 fL       Assessment & Plan:  Adverse reaction to drug, initial encounter - stay off Wellbutrin.  Added to allergy list as an intolerance.  Insomnia/Anxiety/Depression - Plan: TSH, sertraline (ZOLOFT) 50 MG tablet,clonazePAM (KLONOPIN) 0.5 MG tablet  CAD (coronary artery disease) - Plan: Lipid panel, Comprehensive metabolic panel  HTN (hypertension) - Plan: POCT CBC, TSH  Tobacco use - encouraged smoking cessation.    RTC  2 weeks, sooner if needed.  Anticipatory guidance provided.  Fernande Bras, PA-C Physician Assistant-Certified [facility]

## 2012-09-14 NOTE — Patient Instructions (Addendum)
Take 1/2 tablet of the SERTRALINE each day for the first week, then increase to the whole tablet. Use the clonazepam at bedtime. Please stay hydrated, you need to drink 64 ounces of water each day.

## 2012-09-15 ENCOUNTER — Encounter (HOSPITAL_COMMUNITY): Payer: Self-pay | Admitting: Emergency Medicine

## 2012-09-15 ENCOUNTER — Emergency Department (HOSPITAL_COMMUNITY): Payer: Self-pay

## 2012-09-15 ENCOUNTER — Emergency Department (HOSPITAL_COMMUNITY)
Admission: EM | Admit: 2012-09-15 | Discharge: 2012-09-15 | Disposition: A | Discharge status: Home / Self Care | Payer: Self-pay | Attending: Emergency Medicine | Admitting: Emergency Medicine

## 2012-09-15 DIAGNOSIS — F411 Generalized anxiety disorder: Secondary | ICD-10-CM | POA: Insufficient documentation

## 2012-09-15 DIAGNOSIS — F17200 Nicotine dependence, unspecified, uncomplicated: Secondary | ICD-10-CM | POA: Insufficient documentation

## 2012-09-15 DIAGNOSIS — S8000XA Contusion of unspecified knee, initial encounter: Secondary | ICD-10-CM | POA: Insufficient documentation

## 2012-09-15 DIAGNOSIS — W1809XA Striking against other object with subsequent fall, initial encounter: Secondary | ICD-10-CM | POA: Insufficient documentation

## 2012-09-15 DIAGNOSIS — F3289 Other specified depressive episodes: Secondary | ICD-10-CM | POA: Insufficient documentation

## 2012-09-15 DIAGNOSIS — I10 Essential (primary) hypertension: Secondary | ICD-10-CM | POA: Insufficient documentation

## 2012-09-15 DIAGNOSIS — Y93E6 Activity, residential relocation: Secondary | ICD-10-CM | POA: Insufficient documentation

## 2012-09-15 DIAGNOSIS — Y929 Unspecified place or not applicable: Secondary | ICD-10-CM | POA: Insufficient documentation

## 2012-09-15 DIAGNOSIS — F329 Major depressive disorder, single episode, unspecified: Secondary | ICD-10-CM | POA: Insufficient documentation

## 2012-09-15 DIAGNOSIS — I252 Old myocardial infarction: Secondary | ICD-10-CM | POA: Insufficient documentation

## 2012-09-15 DIAGNOSIS — Z7982 Long term (current) use of aspirin: Secondary | ICD-10-CM | POA: Insufficient documentation

## 2012-09-15 DIAGNOSIS — Z79899 Other long term (current) drug therapy: Secondary | ICD-10-CM | POA: Insufficient documentation

## 2012-09-15 DIAGNOSIS — S8001XA Contusion of right knee, initial encounter: Secondary | ICD-10-CM

## 2012-09-15 DIAGNOSIS — X503XXA Overexertion from repetitive movements, initial encounter: Secondary | ICD-10-CM | POA: Insufficient documentation

## 2012-09-15 DIAGNOSIS — T281XXA Burn of esophagus, initial encounter: Secondary | ICD-10-CM | POA: Insufficient documentation

## 2012-09-15 DIAGNOSIS — S39012A Strain of muscle, fascia and tendon of lower back, initial encounter: Secondary | ICD-10-CM

## 2012-09-15 MED ORDER — NAPROXEN 500 MG PO TABS: 500.0000 mg | ORAL_TABLET | Freq: Two times a day (BID) | ORAL | Status: DC

## 2012-09-15 MED ORDER — HYDROCODONE-ACETAMINOPHEN 5-325 MG PO TABS: 1.0000 | ORAL_TABLET | Freq: Four times a day (QID) | ORAL | Status: DC | PRN

## 2012-09-15 NOTE — ED Notes (Signed)
Pt c/o l leg injury and pain x3 days.  Reports he dropped a refrigerator on leg and pain has increased.

## 2012-09-15 NOTE — ED Provider Notes (Signed)
History  This chart was scribed for Barry Crumble, PA-C working with [name], MD by [name], ED scribe. This patient was seen in room WTR9/WTR9 and the patient's care was started at 5:40 PM.   CSN: [id number]  Arrival date & time 09/15/12  1652  Chief Complaint  Patient presents with  . Leg Injury    The history is provided by the patient and medical records. No language interpreter was used.   HPI Comments: Barry Cherry. is a 37 y.o. male who presents to the Emergency Department complaining of moderate to severe constant worsening pain in left leg after a double door refrigerator fell on it 3 days ago.  He was helping his sister move.  The pain is exacerbated with bending.  Pt states he has taken tylenol with relief. Pt denies headache, diaphoresis, fever, chills, nausea, vomiting, diarrhea, weakness, cough, SOB and any other pain.    Past Medical History  Diagnosis Date  . MI (myocardial infarction) Feb 2014  . Depression   . Anxiety   . Hypertension     Past Surgical History  Procedure Laterality Date  . Carpal tunnel release surgery      No family history on file.  History  Substance Use Topics  . Smoking status: Current Every Day Smoker -- 1.00 packs/day for 20 years    Types: Cigarettes  . Smokeless tobacco: Never Used  . Alcohol Use: No      Review of Systems  Constitutional: Negative for fever and chills.  HENT: Negative for sore throat, sneezing and neck pain.   Respiratory: Negative for cough and shortness of breath.   Gastrointestinal: Negative for nausea, vomiting and diarrhea.  All other systems reviewed and are negative.    Allergies  Penicillins; Tramadol; and Wellbutrin  Home Medications   Current Outpatient Rx  Name  Route  Sig  Dispense  Refill  . acetaminophen (TYLENOL) 500 MG tablet   Oral   Take 500 mg by mouth every 6 (six) hours as needed for pain.         Marland Kitchen aspirin 81 MG tablet   Oral   Take 81 mg by mouth  daily.         . B Complex Vitamins (VITAMIN B COMPLEX PO)   Oral   Take 1 tablet by mouth daily.         . clonazePAM (KLONOPIN) 0.5 MG tablet      Take a 1/2 or a whole tablet at bedtime as needed for insomnia   30 tablet   0   . sertraline (ZOLOFT) 50 MG tablet   Oral   Take 25 mg by mouth daily.           BP 139/94  Pulse 94  Temp(Src) 98.2 F (36.8 C) (Oral)  Resp 16  SpO2 100%  Physical Exam  Nursing note and vitals reviewed. Constitutional: He is oriented to person, place, and time. He appears well-developed and well-nourished. No distress.  HENT:  Head: Normocephalic and atraumatic.  Eyes: EOM are normal.  Neck: Neck supple. No tracheal deviation present.  Cardiovascular: Normal rate.   Pulmonary/Chest: Effort normal. No respiratory distress.  Musculoskeletal: Normal range of motion.  Normal appearing left knee.  Tender over medial knee joint.  Full ROM of left knee.  Pain with flexion.  Negative anterior posterior drawer signs.  No pain or laxity with medial or lateral stress.  Pain in right buttock with left straight raise.  Neurological:  He is alert and oriented to person, place, and time.  Skin: Skin is warm and dry.  Psychiatric: He has a normal mood and affect. His behavior is normal.    ED Course  Procedures (including critical care time) DIAGNOSTIC STUDIES: Oxygen Saturation is 100% on RA, normal by my interpretation.    COORDINATION OF CARE: 5:40 PM - Discussed ED treatment with pt at bedside including x-rays and pt agrees.     Labs Reviewed - No data to display No results found.   1. Contusion of right knee, initial encounter   2. Lumbar strain, initial encounter       MDM  Pt with left knee and left hip/thigh pain after lifting heavy furtniture and dropping refrigerator on it. X-ray negative. No signs of cauda equina. Neurovascular intact.  Home with pain medications and follow up.   Filed Vitals:   09/15/12 1728  BP: 139/94   Pulse: 94  Temp: 98.2 F (36.8 C)  Resp: 16     I personally performed the services described in this documentation, which was scribed in my presence. The recorded information has been reviewed and is accurate.   Lottie Mussel, PA-C 09/16/12 0211  Lottie Mussel, PA-C 09/16/12 [phone]

## 2012-09-16 NOTE — ED Provider Notes (Signed)
Medical screening examination/treatment/procedure(s) were performed by non-physician practitioner and as supervising physician I was immediately available for consultation/collaboration.  Elivia Robotham L Loan Oguin, MD 09/16/12 1534 

## 2012-09-29 ENCOUNTER — Telehealth: Payer: Self-pay

## 2012-09-29 NOTE — Telephone Encounter (Signed)
PT WAS SUPPOSE TO F/U WITH HIS VISIT. PLEASE CALL [phone]

## 2012-09-30 NOTE — Telephone Encounter (Signed)
Pt wanted to know results to his lab work, went over results and advised him to follow up this week, since he was suppose to follow up in 2 weeks. He is going to come in either Wednesday or Friday to see Chelle and follow up.

## 2012-10-16 ENCOUNTER — Other Ambulatory Visit: Payer: Self-pay | Admitting: Physician Assistant

## 2012-11-17 ENCOUNTER — Other Ambulatory Visit: Payer: Self-pay | Admitting: Physician Assistant

## 2014-04-11 ENCOUNTER — Emergency Department (HOSPITAL_COMMUNITY): Payer: Self-pay

## 2014-04-11 ENCOUNTER — Encounter (HOSPITAL_COMMUNITY): Payer: Self-pay | Admitting: Emergency Medicine

## 2014-04-11 ENCOUNTER — Emergency Department (HOSPITAL_COMMUNITY)
Admission: EM | Admit: 2014-04-11 | Discharge: 2014-04-11 | Disposition: A | Discharge status: Home / Self Care | Payer: Self-pay | Attending: Emergency Medicine | Admitting: Emergency Medicine

## 2014-04-11 DIAGNOSIS — Z720 Tobacco use: Secondary | ICD-10-CM | POA: Insufficient documentation

## 2014-04-11 DIAGNOSIS — F329 Major depressive disorder, single episode, unspecified: Secondary | ICD-10-CM | POA: Insufficient documentation

## 2014-04-11 DIAGNOSIS — F419 Anxiety disorder, unspecified: Secondary | ICD-10-CM | POA: Insufficient documentation

## 2014-04-11 DIAGNOSIS — R110 Nausea: Secondary | ICD-10-CM | POA: Insufficient documentation

## 2014-04-11 DIAGNOSIS — Z79899 Other long term (current) drug therapy: Secondary | ICD-10-CM | POA: Insufficient documentation

## 2014-04-11 DIAGNOSIS — I10 Essential (primary) hypertension: Secondary | ICD-10-CM | POA: Insufficient documentation

## 2014-04-11 DIAGNOSIS — Z880 Allergy status to penicillin: Secondary | ICD-10-CM | POA: Insufficient documentation

## 2014-04-11 DIAGNOSIS — I252 Old myocardial infarction: Secondary | ICD-10-CM | POA: Insufficient documentation

## 2014-04-11 DIAGNOSIS — Z7982 Long term (current) use of aspirin: Secondary | ICD-10-CM | POA: Insufficient documentation

## 2014-04-11 DIAGNOSIS — R1031 Right lower quadrant pain: Secondary | ICD-10-CM | POA: Insufficient documentation

## 2014-04-11 LAB — URINALYSIS, ROUTINE W REFLEX MICROSCOPIC
BILIRUBIN URINE: NEGATIVE
Glucose, UA: NEGATIVE mg/dL
Hgb urine dipstick: NEGATIVE
Ketones, ur: NEGATIVE mg/dL
LEUKOCYTES UA: NEGATIVE
NITRITE: NEGATIVE
PH: 5.5 (ref 5.0–8.0)
Protein, ur: NEGATIVE mg/dL
SPECIFIC GRAVITY, URINE: 1.021 (ref 1.005–1.030)
Urobilinogen, UA: 0.2 mg/dL (ref 0.0–1.0)

## 2014-04-11 LAB — COMPREHENSIVE METABOLIC PANEL
ALBUMIN: 4.4 g/dL (ref 3.5–5.2)
ALT: 18 U/L (ref 0–53)
AST: 26 U/L (ref 0–37)
Alkaline Phosphatase: 78 U/L (ref 39–117)
Anion gap: 6 (ref 5–15)
BILIRUBIN TOTAL: 0.3 mg/dL (ref 0.3–1.2)
BUN: 10 mg/dL (ref 6–23)
CHLORIDE: 99 meq/L (ref 96–112)
CO2: 29 mmol/L (ref 19–32)
CREATININE: 0.91 mg/dL (ref 0.50–1.35)
Calcium: 8.9 mg/dL (ref 8.4–10.5)
GFR calc Af Amer: >90 mL/min (ref >90)
GFR calc non Af Amer: >90 mL/min (ref >90)
Glucose, Bld: 112 mg/dL — ABNORMAL HIGH (ref 70–99)
Potassium: 3.4 mmol/L — ABNORMAL LOW (ref 3.5–5.1)
SODIUM: 134 mmol/L — AB (ref 135–145)
Total Protein: 7 g/dL (ref 6.0–8.3)

## 2014-04-11 LAB — CBC WITH DIFFERENTIAL/PLATELET
BASOS ABS: 0 10*3/uL (ref 0.0–0.1)
BASOS PCT: 0 % (ref 0–1)
Eosinophils Absolute: 0.2 10*3/uL (ref 0.0–0.7)
Eosinophils Relative: 3 % (ref 0–5)
HCT: 49.5 % (ref 39.0–52.0)
Hemoglobin: 16.7 g/dL (ref 13.0–17.0)
Lymphocytes Relative: 37 % (ref 12–46)
Lymphs Abs: 3.3 10*3/uL (ref 0.7–4.0)
MCH: 32.1 pg (ref 26.0–34.0)
MCHC: 33.7 g/dL (ref 30.0–36.0)
MCV: 95 fL (ref 78.0–100.0)
MONO ABS: 0.6 10*3/uL (ref 0.1–1.0)
Monocytes Relative: 6 % (ref 3–12)
NEUTROS ABS: 4.7 10*3/uL (ref 1.7–7.7)
NEUTROS PCT: 54 % (ref 43–77)
PLATELETS: 263 10*3/uL (ref 150–400)
RBC: 5.21 MIL/uL (ref 4.22–5.81)
RDW: 13.3 % (ref 11.5–15.5)
WBC: 8.8 10*3/uL (ref 4.0–10.5)

## 2014-04-11 LAB — LIPASE, BLOOD: LIPASE: 26 U/L (ref 11–59)

## 2014-04-11 MED ORDER — SODIUM CHLORIDE 0.9 % IV BOLUS (SEPSIS)
1000.0000 mL | Freq: Once | INTRAVENOUS | Status: AC
  Administered 2014-04-11: 1000 mL via INTRAVENOUS

## 2014-04-11 MED ORDER — SODIUM CHLORIDE 0.9 % IV BOLUS (SEPSIS)
1000.0000 mL | Freq: Once | INTRAVENOUS | Status: AC
Start: 2014-04-11 — End: 2014-04-11
  Administered 2014-04-11: 1000 mL via INTRAVENOUS

## 2014-04-11 MED ORDER — NAPROXEN 500 MG PO TABS: 500.0000 mg | ORAL_TABLET | Freq: Two times a day (BID) | ORAL | Status: DC

## 2014-04-11 MED ORDER — MORPHINE SULFATE 4 MG/ML IJ SOLN
6.0000 mg | Freq: Once | INTRAMUSCULAR | Status: AC
  Administered 2014-04-11: 6 mg via INTRAVENOUS
  Filled 2014-04-11: qty 2

## 2014-04-11 MED ORDER — IOHEXOL 300 MG/ML  SOLN
50.0000 mL | Freq: Once | INTRAMUSCULAR | Status: AC | PRN
  Administered 2014-04-11: 50 mL via ORAL

## 2014-04-11 MED ORDER — IOHEXOL 300 MG/ML  SOLN
100.0000 mL | Freq: Once | INTRAMUSCULAR | Status: AC | PRN
  Administered 2014-04-11: 100 mL via INTRAVENOUS

## 2014-04-11 NOTE — ED Notes (Signed)
Pt reports lower abdominal pain for the past 4 days, reports nausea 4 days ago, but not since. Pt has been taking Tylenol and Ibuprofen without relief. No previous hx. LBM yesterday.

## 2014-04-11 NOTE — ED Provider Notes (Signed)
CSN: [id number]     Arrival date & time 04/11/14  0052 History   First MD Initiated Contact with Patient 04/11/14 [phone]     Chief Complaint  Patient presents with  . Abdominal Pain      HPI Patient presents to the emergency department complaining of nausea and right-sided abdominal pain over the past 4 days.  He denies vomiting.  He tried ibuprofen and Tylenol without improvement in his symptoms.  No urinary complaints.  Last bowel movement was yesterday.  No melena or hematochezia.  Denies upper abdominal pain.  He states this is constant and located in his right lower quadrant.  No fever.  No anorexia.  Pain is mild to moderate.   Past Medical History  Diagnosis Date  . MI (myocardial infarction) Feb 2014  . Depression   . Anxiety   . Hypertension    Past Surgical History  Procedure Laterality Date  . Carpal tunnel release surgery     History reviewed. No pertinent family history. History  Substance Use Topics  . Smoking status: Current Every Day Smoker -- 1.00 packs/day for 20 years    Types: Cigarettes  . Smokeless tobacco: Never Used  . Alcohol Use: Yes     Comment: occ.    Review of Systems  All other systems reviewed and are negative.     Allergies  Penicillins; Tramadol; and Wellbutrin  Home Medications   Prior to Admission medications   Medication Sig Start Date End Date Taking? Authorizing Provider  acetaminophen (TYLENOL) 500 MG tablet Take 500 mg by mouth every 6 (six) hours as needed for pain.   Yes Historical Provider, MD  ibuprofen (ADVIL,MOTRIN) 200 MG tablet Take 400 mg by mouth every 6 (six) hours as needed for moderate pain.   Yes Historical Provider, MD  aspirin 81 MG tablet Take 81 mg by mouth daily.    Historical Provider, MD  B Complex Vitamins (VITAMIN B COMPLEX PO) Take 1 tablet by mouth daily.    Historical Provider, MD  clonazePAM (KLONOPIN) 0.5 MG tablet Take 0.5-1 tablets (0.25-0.5 mg total) by mouth at bedtime as needed for anxiety.  Need office visit for additional refills. Patient not taking: Reported on 04/11/2014 10/16/12   Fernande Brashelle S Jeffery, PA-C       Lyanne CoKevin M Darren Nodal, MD  sertraline (ZOLOFT) 50 MG tablet Take 25 mg by mouth daily. 09/14/12   Chelle S Jeffery, PA-C   BP 128/88 mmHg  Pulse 90  Temp(Src) 98.2 F (36.8 C) (Oral)  Resp 16  Wt 125 lb (56.7 kg)  SpO2 98% Physical Exam  Constitutional: He is oriented to person, place, and time. He appears well-developed and well-nourished.  HENT:  Head: Normocephalic and atraumatic.  Eyes: EOM are normal.  Neck: Normal range of motion.  Cardiovascular: Normal rate, regular rhythm, normal heart sounds and intact distal pulses.   Pulmonary/Chest: Effort normal and breath sounds normal. No respiratory distress.  Abdominal: Soft. He exhibits no distension.  While right lower quadrant abdominal tenderness without guarding or rebound.  No peritoneal signs  Musculoskeletal: Normal range of motion.  Neurological: He is alert and oriented to person, place, and time.  Skin: Skin is warm and dry.  Psychiatric: He has a normal mood and affect. Judgment normal.  Nursing note and vitals reviewed.   ED Course  Procedures (including critical care time) Labs Review Labs Reviewed  COMPREHENSIVE METABOLIC PANEL - Abnormal; Notable for the following:    Sodium 134 (*)  Potassium 3.4 (*)    Glucose, Bld 112 (*)    All other components within normal limits  CBC WITH DIFFERENTIAL  LIPASE, BLOOD  URINALYSIS, ROUTINE W REFLEX MICROSCOPIC    Imaging Review Ct Abdomen Pelvis W Contrast  04/11/2014   CLINICAL DATA:  Lower abdominal pain 4 days with nausea.  EXAM: CT ABDOMEN AND PELVIS WITH CONTRAST  TECHNIQUE: Multidetector CT imaging of the abdomen and pelvis was performed using the standard protocol following bolus administration of intravenous contrast.  CONTRAST:  50mL OMNIPAQUE IOHEXOL 300 MG/ML SOLN, 100mL OMNIPAQUE IOHEXOL 300 MG/ML SOLN  COMPARISON:  12/07/2008  FINDINGS:  Lung bases are within normal.  Abdominal images demonstrate mild diffuse hepatic steatosis. The spleen, pancreas, gallbladder and adrenal glands are normal. Kidneys are normal without hydronephrosis or nephrolithiasis. Ureters are within normal. The appendix is normal. Vascular structures are within normal. There are a few mildly prominent jejunal loops in the left mid to upper abdomen.  Pelvic images demonstrate the bladder, prostate and rectum to be within normal. Remaining bones soft tissues are unremarkable.  IMPRESSION: No acute findings in the abdomen/pelvis.  Minimal diffuse hepatic steatosis.   Electronically Signed   By: Elberta Fortisaniel  Boyle M.D.   On: 04/11/2014 09:49     EKG Interpretation None      MDM   Final diagnoses:  Right lower quadrant abdominal pain    CT scan without acute findings.  Appendix is visualized and is normal on CT.  A blood cell count is normal.  Patient feels better.  Discharge home in good condition.  She understands to return to the ER for new or worsening symptoms.    Lyanne CoKevin M Adonay Scheier, MD 04/11/14 [phone]

## 2014-08-06 NOTE — Discharge Summary (Signed)
PATIENT NAME:  Barry Cherry, Barry Cherry MR#:  [id number] DATE OF BIRTH:  [birth year]  DATE OF ADMISSION:  06/13/2012 DATE OF DISCHARGE:  06/13/2012  The patient actually walked out AGAINST MEDICAL ADVICE on the night of admission sometime.   CHIEF COMPLAINT: Chest pain diagnosed at the time of walking out AGAINST MEDICAL ADVICE was chest pain, possibly unstable angina, as it was exertional.   LABORATORIES ON ADMISSION AND AT THE TIME OF DISCHARGE:  The patient had a creatinine of 1.01, BUN of 11, sodium of 139. LFTs were within normal limits. Ethanol was below detectable limits. His troponins came back negative x 3. CK-MB was normal x 2. Initial WBC was 15.9 and chest, PA and lateral, showed no acute cardiopulmonary disease.  HISTORY OF PRESENT ILLNESS AND HOSPITAL COURSE:  For full details of H and P, please see the dictation on 06/13/2012 by Dr. Jacques NavyAhmadzia.  But, in brief, this is a 39 year old male who came in with exertional chest pain during sex with his wife and was admitted to the hospital service for exertional chest pain, as he has slightly elevated troponin per ER physician who wanted him to be admitted to the hospital for further workup. The patient was ruled out for acute coronary syndrome; however, walked out AGAINST MEDICAL ADVICE per staff last night and at this point workup has not finished. An echocardiogram, as well as a stress test had been ordered as part of the workup.  Total Time Spent:  20 minutes     ____________________________ Barry EatonShayiq Maximiano Lott, MD sa:ce D: 06/14/2012 14:13:50 ET T: 06/14/2012 15:26:30 ET JOB#: [id number]  cc: Barry EatonShayiq Eduin Friedel, MD, <Dictator> Barry EatonSHAYIQ Michaela Broski MD ELECTRONICALLY SIGNED 06/23/2012 13:16

## 2014-08-06 NOTE — H&P (Signed)
PATIENT NAME:  Barry Cherry, Barry Cherry MR#:  [id number] DATE OF BIRTH:  05-18-1975  DATE OF ADMISSION:  06/13/2012  REFERRING PHYSICIAN: Daryel NovemberJonathan Williams, MD  PRIMARY CARE PHYSICIAN: Nonlocal.   CHIEF COMPLAINT: Chest pain.  HISTORY OF PRESENT ILLNESS: The patient is a 39 year old Caucasian male with history of restless leg syndrome, a long time smoker, who presented with chest pain. The pain was last night and occurred during sex with his wife.  The patient stated that it was substernal without any radiation, but did experience some shortness of breath associated with this without any nausea or vomiting. The pain lasted off and on and the patient was dropped off by his wife. Currently he denies having any chest pain. Hospitalist service was contacted for further evaluation and management as troponin went from 0.03 to 0.04. Of note, he has had 2 negative troponins in the last 5 hours. He has no shortness of breath currently. Hospitalist service was contacted for further evaluation and management.   PAST MEDICAL HISTORY: Restless leg syndrome.   PAST SURGICAL HISTORY: Denies.   FAMILY HISTORY: The patient denies any history of coronary artery disease, stroke, diabetes or hypertension.   ALLERGIES:  PENICILLIN AND ULTRAM.   OUTPATIENT MEDICATIONS: Percocet p.r.n. which he takes for his restless leg syndrome, starting several nights ago.   SOCIAL HISTORY: Smokes up to 1/2 pack a day, long-time smoker since age 39. Seldom drinks alcohol. No drug use. Works as an Journalist, newspaperauto mechanic.    REVIEW OF SYSTEMS:    CONSTITUTIONAL: Denies having any fevers, chills, weight changes.  EYES: Denies blurry vision or double vision.  ENT: Denies tinnitus, hearing loss or discharge.  RESPIRATORY: Occasional sputum production with his chronic cough. Denies shortness of breath currently, but did have a bout of shortness of breath while he had the chest pain.  CARDIOVASCULAR: Chest pain as above. No swelling in the legs,  palpitations or syncope.  GASTROINTESTINAL: Denies nausea, vomiting, diarrhea, abdominal pain or dark stools. Denies vomiting.  GENITOURINARY: Denies pyuria, hematuria or frequency.  ENDOCRINE: Denies polyuria or nocturia.  SKIN: Denies any rashes.  MUSCULOSKELETAL: Has chronic right knee pain.  NEUROLOGIC: Denies numbness, weakness.  PSYCHIATRIC: Denies anxiety or insomnia.   PHYSICAL EXAMINATION: VITAL SIGNS: Temperature on arrival 97, pulse rate 113, respiratory rate 20, blood pressure 145/97 and O2 sat 99% on room air.  GENERAL: The patient is a Caucasian male sitting in bed, occasionally dozing off into sleep, in no acute distress.  HEENT: Normocephalic, atraumatic. Pupils are equal and reactive. Anicteric sclerae. Extraocular movements intact.  Moist mucous membranes. NECK: Supple. No thyroid tenderness or cervical lymphadenopathy.  CARDIOVASCULAR: S1 and S2 regular rate and rhythm. No murmurs, rubs or gallops.  LUNGS: Clear to auscultation without wheezing, rhonchi or rales.  ABDOMEN: Soft, nontender and nondistended. Positive bowel sounds in all quadrants. No organomegaly noted.  EXTREMITIES: No significant lower extremity edema.  SKIN: No obvious rashes.  NEUROLOGIC: Cranial nerves II through XII appear to be grossly intact. Strength is 5 out of 5 in all extremities. Sensation is intact to light touch.  PSYCH: Awake, alert and oriented x 3, but does doze off to sleep on several occasions.  LABS AND IMAGING STUDIES: X-ray of the chest: No acute cardiopulmonary disease.   Troponin initially 0.03, then 0.04 after a couple of hours. LFTs within normal limits. CK-MB and CK total within normal limits. Creatinine 1.01, sodium 139 and potassium 4.2. WBC 15.9, hemoglobin 15.3 and platelets 282.  EKG: Sinus rhythm. Biatrial enlargement.  Rate is 95. No acute ST elevations or depressions.   ASSESSMENT AND PLAN: We have a 39 year old Caucasian male with restless syndrome and longtime smoker  who presents with exertional chest pain. At this point, we will admit the patient to the hospital for evaluation and monitoring to rule out acute coronary syndrome. The pain was exertional and he is a longtime smoker. There is no significant EKG changes and he has negative troponins x 2, but troponins were drawn 2 hours after the first draw. We will cycle the troponins and get an echocardiogram as well as order a stress test for tomorrow. Check lipid profile and hemoglobin A1c. Start the patient on aspirin and low-dose beta blocker. Check lipids and admit him to a tele bed. He was counseled for 3 minutes about smoking cessation. He did not agree to a patch at this moment. Put on heparin for deep vein thrombosis prophylaxis. The patient is FULL CODE.  TOTAL TIME SPENT: 45 minutes.  ____________________________ Krystal Eaton, MD sa:sb D: 06/13/2012 10:17:33 ET T: 06/13/2012 10:38:40 ET JOB#: [id number]  cc: Krystal Eaton, MD, <Dictator> Krystal Eaton MD ELECTRONICALLY SIGNED 06/23/2012 13:15

## 2020-11-18 ENCOUNTER — Emergency Department
Admission: EM | Admit: 2020-11-18 | Discharge: 2020-11-19 | Disposition: A | Discharge status: Home / Self Care | Payer: Medicaid Other | Attending: Emergency Medicine | Admitting: Emergency Medicine

## 2020-11-18 ENCOUNTER — Other Ambulatory Visit: Payer: Self-pay

## 2020-11-18 ENCOUNTER — Encounter: Payer: Self-pay | Admitting: Emergency Medicine

## 2020-11-18 DIAGNOSIS — T402X1A Poisoning by other opioids, accidental (unintentional), initial encounter: Secondary | ICD-10-CM | POA: Diagnosis not present

## 2020-11-18 DIAGNOSIS — R4182 Altered mental status, unspecified: Secondary | ICD-10-CM | POA: Insufficient documentation

## 2020-11-18 DIAGNOSIS — I119 Hypertensive heart disease without heart failure: Secondary | ICD-10-CM | POA: Diagnosis not present

## 2020-11-18 DIAGNOSIS — T40601A Poisoning by unspecified narcotics, accidental (unintentional), initial encounter: Secondary | ICD-10-CM

## 2020-11-18 DIAGNOSIS — Z79899 Other long term (current) drug therapy: Secondary | ICD-10-CM | POA: Insufficient documentation

## 2020-11-18 DIAGNOSIS — I609 Nontraumatic subarachnoid hemorrhage, unspecified: Secondary | ICD-10-CM | POA: Diagnosis not present

## 2020-11-18 DIAGNOSIS — I2510 Atherosclerotic heart disease of native coronary artery without angina pectoris: Secondary | ICD-10-CM | POA: Diagnosis not present

## 2020-11-18 DIAGNOSIS — F17210 Nicotine dependence, cigarettes, uncomplicated: Secondary | ICD-10-CM | POA: Diagnosis not present

## 2020-11-18 DIAGNOSIS — Z7982 Long term (current) use of aspirin: Secondary | ICD-10-CM | POA: Diagnosis not present

## 2020-11-18 NOTE — ED Triage Notes (Signed)
Patient presents via acems with c/o overdose on unknown substance. Per EMS, patient was given 2mg  narcan in route. Patient respirations then increased to 16 per minute and patient became more alert. Per ems patient was aggressive when he woke up. Family of patient states that they are unaware if patient has hx of drug use.

## 2020-11-18 NOTE — ED Notes (Signed)
Vol patient too out of it for consult

## 2020-11-18 NOTE — ED Provider Notes (Signed)
[facility] Emergency Department Provider Note   ____________________________________________   Event Date/Time   First MD Initiated Contact with Patient 11/18/20 1537     (approximate)  I have reviewed the triage vital signs and the nursing notes.   HISTORY  Chief Complaint Drug Overdose    HPI Barry Cherry. is a 45 y.o. male who presents via EMS after an assumed overdose.  Patient is not able to participate in history or review of systems at this time given altered mental status.  First responders state that they gave him 2 doses of Narcan with improvement in his respiratory rate however he did become somewhat combative.  Unclear as to how much or what patient took only that "he snorted all of it".  Per patient's significant other, "this is the first time he has ever done drugs".          Past Medical History:  Diagnosis Date   Anxiety    Depression    Hypertension    MI (myocardial infarction) [facility]) Feb 2014    Patient Active Problem List   Diagnosis Date Noted   CAD (coronary artery disease) 09/14/2012   HTN (hypertension) 09/14/2012   Tobacco use 09/14/2012   Anxiety 09/14/2012   Depression 09/14/2012    Past Surgical History:  Procedure Laterality Date   carpal tunnel release surgery      Prior to Admission medications   Medication Sig Start Date End Date Taking? Authorizing Provider  acetaminophen (TYLENOL) 500 MG tablet Take 500 mg by mouth every 6 (six) hours as needed for pain. Patient not taking: Reported on 11/18/2020    [provider]  aspirin 81 MG tablet Take 81 mg by mouth daily. Patient not taking: Reported on 11/18/2020    [provider]  B Complex Vitamins (VITAMIN B COMPLEX PO) Take 1 tablet by mouth daily. Patient not taking: Reported on 11/18/2020    [provider]  clonazePAM (KLONOPIN) 0.5 MG tablet Take 0.5-1 tablets (0.25-0.5 mg total) by mouth at bedtime as needed for anxiety.  Need office visit for additional refills. Patient not taking: No sig reported 10/16/12   Porfirio Oar, PA  ibuprofen (ADVIL,MOTRIN) 200 MG tablet Take 400 mg by mouth every 6 (six) hours as needed for moderate pain. Patient not taking: Reported on 11/18/2020    [provider]  naproxen (NAPROSYN) 500 MG tablet Take 1 tablet (500 mg total) by mouth 2 (two) times daily. Patient not taking: No sig reported 04/11/14   Azalia Bilis, MD  sertraline (ZOLOFT) 50 MG tablet Take 25 mg by mouth daily. Patient not taking: Reported on 11/18/2020 09/14/12   Porfirio Oar, PA    Allergies Penicillins, Tramadol, and Wellbutrin [bupropion]  History reviewed. No pertinent family history.  Social History Social History   Tobacco Use   Smoking status: Every Day    Packs/day: 1.00    Years: 20.00    Pack years: 20.00    Types: Cigarettes   Smokeless tobacco: Never  Substance Use Topics   Alcohol use: Yes    Comment: occ.   Drug use: No    Review of Systems Unable to evaluate given altered mental status secondary to intoxication ____________________________________________   PHYSICAL EXAM:  VITAL SIGNS: ED Triage Vitals  Enc Vitals Group     BP      Pulse      Resp      Temp      Temp src  SpO2      Weight      Height      Head Circumference      Peak Flow      Pain Score      Pain Loc      Pain Edu?      Excl. in GC?    Constitutional: Somnolent but arousable resting comfortably in his gurney Eyes: Conjunctivae are injected. PERRL. Head: Atraumatic. Nose: No congestion/rhinnorhea. Mouth/Throat: Mucous membranes are moist. Neck: No stridor Cardiovascular: Grossly normal heart sounds.  Good peripheral circulation. Respiratory: Normal respiratory effort.  No retractions. Gastrointestinal: Soft and nontender. No distention. Musculoskeletal: No obvious deformities. Neurologic:  Normal speech and language. No gross focal neurologic deficits are appreciated. Skin:   Skin is warm and dry. No rash noted.  Small abrasions noted to the dorsum of bilateral hands Psychiatric: Uncooperative  ____________________________________________   LABS (all labs ordered are listed, but only abnormal results are displayed)  Labs Reviewed - No data to display ____________________________________________  EKG  ED ECG REPORT I, Merwyn Katos, the attending physician, personally viewed and interpreted this ECG.  Date: 11/18/2020 EKG Time: 1544 Rate: 66 Rhythm: normal sinus rhythm QRS Axis: normal Intervals: normal ST/T Wave abnormalities: normal Narrative Interpretation: no evidence of acute ischemia  ____________________________________________    PROCEDURES  Procedure(s) performed (including Critical Care):  Procedures   ____________________________________________   INITIAL IMPRESSION / ASSESSMENT AND PLAN / ED COURSE  As part of my medical decision making, I reviewed the following data within the electronic medical record, if available:  Nursing notes reviewed and incorporated, Labs reviewed, EKG interpreted, Old chart reviewed, Radiograph reviewed and Notes from prior ED visits reviewed and incorporated        Patient presents after a presumed opiate overdose with on scene description by EMS of: + Altered mental status + Pinpoint pupils + Slurred, sluggish behavior. + Hypotension  Presentation most consistent with opioid ingestion. ED Interventions: None Given History, Exam and Response to Interventions I have a low suspicion for Posterior Circulation Stroke, EtOH Intoxication, Intracranial bleed, Sepsis, Hypothyroidism, Environmental Hypothermia. Reassessment: 2258 patient is more arousable however still somewhat somnolent but maintaining respiratory rate, heart rate, and blood pressure adequately and therefore we will not redose Narcan at this time Disposition: Care of this patient will be signed out to the oncoming physician at the end  of my shift.  All pertinent patient information conveyed and all questions answered.  All further care and disposition decisions will be made by the oncoming physician.      ____________________________________________   FINAL CLINICAL IMPRESSION(S) / ED DIAGNOSES  Final diagnoses:  Opiate overdose, accidental or unintentional, initial encounter [facility])     ED Discharge Orders     None        Note:  This document was prepared using Dragon voice recognition software and may include unintentional dictation errors.    Merwyn Katos, MD 11/18/20 [phone]

## 2020-11-19 ENCOUNTER — Emergency Department
Admission: EM | Admit: 2020-11-19 | Discharge: 2020-11-19 | Disposition: A | Discharge status: Other Acute Inpatient Hospital | Payer: Medicaid Other | Attending: Emergency Medicine | Admitting: Emergency Medicine

## 2020-11-19 ENCOUNTER — Encounter: Payer: Self-pay | Admitting: Emergency Medicine

## 2020-11-19 ENCOUNTER — Emergency Department: Payer: Medicaid Other

## 2020-11-19 ENCOUNTER — Inpatient Hospital Stay (HOSPITAL_COMMUNITY)
Admission: AD | Admit: 2020-11-19 | Discharge: 2020-12-15 | DRG: 020 | Disposition: E | Payer: Medicaid Other | Source: Other Acute Inpatient Hospital | Attending: Neurosurgery | Admitting: Neurosurgery

## 2020-11-19 ENCOUNTER — Other Ambulatory Visit: Payer: Self-pay

## 2020-11-19 DIAGNOSIS — E162 Hypoglycemia, unspecified: Secondary | ICD-10-CM | POA: Diagnosis not present

## 2020-11-19 DIAGNOSIS — Z66 Do not resuscitate: Secondary | ICD-10-CM | POA: Diagnosis not present

## 2020-11-19 DIAGNOSIS — Z09 Encounter for follow-up examination after completed treatment for conditions other than malignant neoplasm: Secondary | ICD-10-CM

## 2020-11-19 DIAGNOSIS — I110 Hypertensive heart disease with heart failure: Secondary | ICD-10-CM | POA: Diagnosis present

## 2020-11-19 DIAGNOSIS — E878 Other disorders of electrolyte and fluid balance, not elsewhere classified: Secondary | ICD-10-CM | POA: Diagnosis not present

## 2020-11-19 DIAGNOSIS — I10 Essential (primary) hypertension: Secondary | ICD-10-CM | POA: Insufficient documentation

## 2020-11-19 DIAGNOSIS — I2510 Atherosclerotic heart disease of native coronary artery without angina pectoris: Secondary | ICD-10-CM | POA: Insufficient documentation

## 2020-11-19 DIAGNOSIS — I5031 Acute diastolic (congestive) heart failure: Secondary | ICD-10-CM | POA: Diagnosis not present

## 2020-11-19 DIAGNOSIS — E872 Acidosis: Secondary | ICD-10-CM | POA: Diagnosis not present

## 2020-11-19 DIAGNOSIS — G919 Hydrocephalus, unspecified: Secondary | ICD-10-CM | POA: Diagnosis present

## 2020-11-19 DIAGNOSIS — T17918A Gastric contents in respiratory tract, part unspecified causing other injury, initial encounter: Secondary | ICD-10-CM

## 2020-11-19 DIAGNOSIS — G928 Other toxic encephalopathy: Secondary | ICD-10-CM | POA: Diagnosis present

## 2020-11-19 DIAGNOSIS — T43621A Poisoning by amphetamines, accidental (unintentional), initial encounter: Secondary | ICD-10-CM | POA: Diagnosis present

## 2020-11-19 DIAGNOSIS — Z885 Allergy status to narcotic agent status: Secondary | ICD-10-CM

## 2020-11-19 DIAGNOSIS — J80 Acute respiratory distress syndrome: Secondary | ICD-10-CM | POA: Diagnosis not present

## 2020-11-19 DIAGNOSIS — N170 Acute kidney failure with tubular necrosis: Secondary | ICD-10-CM | POA: Diagnosis not present

## 2020-11-19 DIAGNOSIS — F17210 Nicotine dependence, cigarettes, uncomplicated: Secondary | ICD-10-CM | POA: Diagnosis present

## 2020-11-19 DIAGNOSIS — I609 Nontraumatic subarachnoid hemorrhage, unspecified: Secondary | ICD-10-CM | POA: Diagnosis not present

## 2020-11-19 DIAGNOSIS — I729 Aneurysm of unspecified site: Secondary | ICD-10-CM

## 2020-11-19 DIAGNOSIS — J9601 Acute respiratory failure with hypoxia: Secondary | ICD-10-CM

## 2020-11-19 DIAGNOSIS — I252 Old myocardial infarction: Secondary | ICD-10-CM

## 2020-11-19 DIAGNOSIS — H5709 Other anomalies of pupillary function: Secondary | ICD-10-CM | POA: Insufficient documentation

## 2020-11-19 DIAGNOSIS — R0603 Acute respiratory distress: Secondary | ICD-10-CM

## 2020-11-19 DIAGNOSIS — R6521 Severe sepsis with septic shock: Secondary | ICD-10-CM | POA: Diagnosis not present

## 2020-11-19 DIAGNOSIS — Z452 Encounter for adjustment and management of vascular access device: Secondary | ICD-10-CM

## 2020-11-19 DIAGNOSIS — Z7982 Long term (current) use of aspirin: Secondary | ICD-10-CM | POA: Diagnosis not present

## 2020-11-19 DIAGNOSIS — Z20822 Contact with and (suspected) exposure to covid-19: Secondary | ICD-10-CM | POA: Diagnosis present

## 2020-11-19 DIAGNOSIS — K7200 Acute and subacute hepatic failure without coma: Secondary | ICD-10-CM | POA: Diagnosis not present

## 2020-11-19 DIAGNOSIS — R579 Shock, unspecified: Secondary | ICD-10-CM

## 2020-11-19 DIAGNOSIS — I615 Nontraumatic intracerebral hemorrhage, intraventricular: Secondary | ICD-10-CM | POA: Diagnosis present

## 2020-11-19 DIAGNOSIS — A419 Sepsis, unspecified organism: Secondary | ICD-10-CM | POA: Diagnosis not present

## 2020-11-19 DIAGNOSIS — Z880 Allergy status to penicillin: Secondary | ICD-10-CM

## 2020-11-19 DIAGNOSIS — R4182 Altered mental status, unspecified: Secondary | ICD-10-CM | POA: Diagnosis present

## 2020-11-19 DIAGNOSIS — Z888 Allergy status to other drugs, medicaments and biological substances status: Secondary | ICD-10-CM

## 2020-11-19 DIAGNOSIS — J189 Pneumonia, unspecified organism: Secondary | ICD-10-CM

## 2020-11-19 DIAGNOSIS — I602 Nontraumatic subarachnoid hemorrhage from anterior communicating artery: Principal | ICD-10-CM | POA: Diagnosis present

## 2020-11-19 DIAGNOSIS — Z789 Other specified health status: Secondary | ICD-10-CM

## 2020-11-19 DIAGNOSIS — Z4659 Encounter for fitting and adjustment of other gastrointestinal appliance and device: Secondary | ICD-10-CM

## 2020-11-19 DIAGNOSIS — E870 Hyperosmolality and hypernatremia: Secondary | ICD-10-CM | POA: Diagnosis not present

## 2020-11-19 DIAGNOSIS — R739 Hyperglycemia, unspecified: Secondary | ICD-10-CM | POA: Diagnosis not present

## 2020-11-19 DIAGNOSIS — J690 Pneumonitis due to inhalation of food and vomit: Secondary | ICD-10-CM | POA: Diagnosis not present

## 2020-11-19 DIAGNOSIS — T17910A Gastric contents in respiratory tract, part unspecified causing asphyxiation, initial encounter: Secondary | ICD-10-CM

## 2020-11-19 DIAGNOSIS — I608 Other nontraumatic subarachnoid hemorrhage: Secondary | ICD-10-CM

## 2020-11-19 DIAGNOSIS — R29724 NIHSS score 24: Secondary | ICD-10-CM | POA: Diagnosis present

## 2020-11-19 DIAGNOSIS — N179 Acute kidney failure, unspecified: Secondary | ICD-10-CM

## 2020-11-19 LAB — POCT I-STAT 7, (LYTES, BLD GAS, ICA,H+H)
Acid-Base Excess: 3 mmol/L — ABNORMAL HIGH (ref 0.0–2.0)
Bicarbonate: 27.8 mmol/L (ref 20.0–28.0)
Calcium, Ion: 1.23 mmol/L (ref 1.15–1.40)
HCT: 47 % (ref 39.0–52.0)
Hemoglobin: 16 g/dL (ref 13.0–17.0)
O2 Saturation: 100 %
Patient temperature: 97.8
Potassium: 3.9 mmol/L (ref 3.5–5.1)
Sodium: 145 mmol/L (ref 135–145)
TCO2: 29 mmol/L (ref 22–32)
pCO2 arterial: 39.9 mmHg (ref 32.0–48.0)
pH, Arterial: 7.449 (ref 7.350–7.450)
pO2, Arterial: 220 mmHg — ABNORMAL HIGH (ref 83.0–108.0)

## 2020-11-19 LAB — CBG MONITORING, ED
Glucose-Capillary: 96 mg/dL (ref 70–99)
Glucose-Capillary: 160 mg/dL — ABNORMAL HIGH (ref 70–99)

## 2020-11-19 LAB — COMPREHENSIVE METABOLIC PANEL
ALT: 102 U/L — ABNORMAL HIGH (ref 0–44)
AST: 97 U/L — ABNORMAL HIGH (ref 15–41)
Albumin: 3.7 g/dL (ref 3.5–5.0)
Alkaline Phosphatase: 126 U/L (ref 38–126)
Anion gap: 10 (ref 5–15)
BUN: 21 mg/dL — ABNORMAL HIGH (ref 6–20)
CO2: 27 mmol/L (ref 22–32)
Calcium: 9.3 mg/dL (ref 8.9–10.3)
Chloride: 106 mmol/L (ref 98–111)
Creatinine, Ser: 0.97 mg/dL (ref 0.61–1.24)
GFR, Estimated: >60 mL/min (ref >60)
Glucose, Bld: 193 mg/dL — ABNORMAL HIGH (ref 70–99)
Potassium: 3.8 mmol/L (ref 3.5–5.1)
Sodium: 143 mmol/L (ref 135–145)
Total Bilirubin: 1.5 mg/dL — ABNORMAL HIGH (ref 0.3–1.2)
Total Protein: 7.8 g/dL (ref 6.5–8.1)

## 2020-11-19 LAB — URINE DRUG SCREEN, QUALITATIVE (ARMC ONLY)
Amphetamines, Ur Screen: POSITIVE — AB
Barbiturates, Ur Screen: NOT DETECTED
Benzodiazepine, Ur Scrn: NOT DETECTED
Cannabinoid 50 Ng, Ur ~~LOC~~: NOT DETECTED
Cocaine Metabolite,Ur ~~LOC~~: NOT DETECTED
MDMA (Ecstasy)Ur Screen: NOT DETECTED
Methadone Scn, Ur: NOT DETECTED
Opiate, Ur Screen: NOT DETECTED
Phencyclidine (PCP) Ur S: NOT DETECTED
Tricyclic, Ur Screen: NOT DETECTED

## 2020-11-19 LAB — CBC WITH DIFFERENTIAL/PLATELET
Abs Immature Granulocytes: 0.14 10*3/uL — ABNORMAL HIGH (ref 0.00–0.07)
Basophils Absolute: 0.1 10*3/uL (ref 0.0–0.1)
Basophils Relative: 0 %
Eosinophils Absolute: 0.6 10*3/uL — ABNORMAL HIGH (ref 0.0–0.5)
Eosinophils Relative: 3 %
HCT: 47.6 % (ref 39.0–52.0)
Hemoglobin: 16.8 g/dL (ref 13.0–17.0)
Immature Granulocytes: 1 %
Lymphocytes Relative: 14 %
Lymphs Abs: 2.9 10*3/uL (ref 0.7–4.0)
MCH: 33.7 pg (ref 26.0–34.0)
MCHC: 35.3 g/dL (ref 30.0–36.0)
MCV: 95.6 fL (ref 80.0–100.0)
Monocytes Absolute: 1.1 10*3/uL — ABNORMAL HIGH (ref 0.1–1.0)
Monocytes Relative: 5 %
Neutro Abs: 16.1 10*3/uL — ABNORMAL HIGH (ref 1.7–7.7)
Neutrophils Relative %: 77 %
Platelets: 188 10*3/uL (ref 150–400)
RBC: 4.98 MIL/uL (ref 4.22–5.81)
RDW: 15 % (ref 11.5–15.5)
WBC: 21 10*3/uL — ABNORMAL HIGH (ref 4.0–10.5)
nRBC: 0 % (ref 0.0–0.2)

## 2020-11-19 LAB — PROTIME-INR
INR: 1.1 (ref 0.8–1.2)
Prothrombin Time: 14.1 seconds (ref 11.4–15.2)

## 2020-11-19 LAB — CK: Total CK: 256 U/L (ref 49–397)

## 2020-11-19 LAB — APTT: aPTT: 29 seconds (ref 24–36)

## 2020-11-19 LAB — MRSA NEXT GEN BY PCR, NASAL: MRSA by PCR Next Gen: NOT DETECTED

## 2020-11-19 MED ORDER — NIMODIPINE 30 MG PO CAPS
60.0000 mg | ORAL_CAPSULE | ORAL | Status: DC
Start: 2020-11-19 — End: 2020-11-24

## 2020-11-19 MED ORDER — PROPOFOL 1000 MG/100ML IV EMUL
5.0000 ug/kg/min | INTRAVENOUS | Status: DC
  Filled 2020-11-19: qty 100

## 2020-11-19 MED ORDER — PROPOFOL 1000 MG/100ML IV EMUL
5.0000 ug/kg/min | INTRAVENOUS | Status: DC
  Administered 2020-11-19: 20 ug/kg/min via INTRAVENOUS
  Filled 2020-11-19: qty 100

## 2020-11-19 MED ORDER — FENTANYL CITRATE (PF) 100 MCG/2ML IJ SOLN
100.0000 ug | Freq: Once | INTRAMUSCULAR | Status: AC
  Administered 2020-11-19: 100 ug via INTRAVENOUS

## 2020-11-19 MED ORDER — ETOMIDATE 2 MG/ML IV SOLN
INTRAVENOUS | Status: AC | PRN
  Administered 2020-11-19: 20 mg via INTRAVENOUS

## 2020-11-19 MED ORDER — PROPOFOL 1000 MG/100ML IV EMUL
5.0000 ug/kg/min | INTRAVENOUS | Status: DC
  Administered 2020-11-19: 40 ug/kg/min via INTRAVENOUS
  Administered 2020-11-20: 20 ug/kg/min via INTRAVENOUS
  Administered 2020-11-20: 50 ug/kg/min via INTRAVENOUS
  Filled 2020-11-19 (×3): qty 100

## 2020-11-19 MED ORDER — FENTANYL 2500MCG IN NS 250ML (10MCG/ML) PREMIX INFUSION
2.0000 ug/h | INTRAVENOUS | Status: DC
Start: 2020-11-19 — End: 2020-11-20

## 2020-11-19 MED ORDER — CHLORHEXIDINE GLUCONATE CLOTH 2 % EX PADS
6.0000 | MEDICATED_PAD | Freq: Every day | CUTANEOUS | Status: DC
  Administered 2020-11-19 – 2020-11-23 (×5): 6 via TOPICAL

## 2020-11-19 MED ORDER — NICARDIPINE HCL IN NACL 20-0.86 MG/200ML-% IV SOLN: 0.0000 mg/h | INTRAVENOUS | Status: DC

## 2020-11-19 MED ORDER — FENTANYL 2500MCG IN NS 250ML (10MCG/ML) PREMIX INFUSION
30.0000 ug/h | INTRAVENOUS | Status: DC
  Administered 2020-11-19: 30 ug/h via INTRAVENOUS
  Filled 2020-11-19: qty 250

## 2020-11-19 MED ORDER — SUCCINYLCHOLINE CHLORIDE 20 MG/ML IJ SOLN
INTRAMUSCULAR | Status: AC | PRN
  Administered 2020-11-19: 100 mg via INTRAVENOUS

## 2020-11-19 MED ORDER — PANTOPRAZOLE SODIUM 40 MG IV SOLR
40.0000 mg | INTRAVENOUS | Status: DC
  Administered 2020-11-19 – 2020-11-21 (×3): 40 mg via INTRAVENOUS
  Filled 2020-11-19 (×3): qty 40

## 2020-11-19 MED ORDER — STROKE: EARLY STAGES OF RECOVERY BOOK
Freq: Once | Status: AC
  Filled 2020-11-19: qty 1

## 2020-11-19 MED ORDER — IOHEXOL 350 MG/ML SOLN
75.0000 mL | Freq: Once | INTRAVENOUS | Status: AC | PRN
  Administered 2020-11-19: 75 mL via INTRAVENOUS

## 2020-11-19 MED ORDER — NICARDIPINE HCL IN NACL 20-0.86 MG/200ML-% IV SOLN
0.0000 mg/h | INTRAVENOUS | Status: DC
  Administered 2020-11-19: 5 mg/h via INTRAVENOUS
  Filled 2020-11-19: qty 200

## 2020-11-19 MED ORDER — SODIUM CHLORIDE 0.9 % IV SOLN
INTRAVENOUS | Status: DC
  Administered 2020-11-20: 1000 mL via INTRAVENOUS
  Administered 2020-11-20: 50 mL via INTRAVENOUS

## 2020-11-19 MED ORDER — NIMODIPINE 6 MG/ML PO SOLN
60.0000 mg | ORAL | Status: DC
  Administered 2020-11-19 – 2020-11-23 (×18): 60 mg
  Filled 2020-11-19 (×19): qty 10

## 2020-11-19 NOTE — ED Notes (Signed)
Crystal on the way to take patient home.

## 2020-11-19 NOTE — ED Triage Notes (Signed)
Pt was here and discharged, family brought him back because he is still very drowsy.

## 2020-11-19 NOTE — ED Notes (Signed)
Patient at CT

## 2020-11-19 NOTE — ED Notes (Signed)
RN unable to obtain VS - pt will not keep still in bed.

## 2020-11-19 NOTE — ED Notes (Signed)
Pt's father call to speak with patient.  Pt not alert enough to speak on the phone.  EDP gave pt's father an update.

## 2020-11-19 NOTE — ED Notes (Signed)
Patient belongings- dentures and shoes take home by girlfriend.

## 2020-11-19 NOTE — ED Provider Notes (Signed)
-----------------------------------------   6:44 AM on 11/21/2020 -----------------------------------------   No events overnight. Patient arouses to voice but falls back asleep. Care transferred to Dr. Sidney Ace at change of shift. Anticipate discharge home once patient is sober and ambulatory with steady gait.   Irean Hong, MD 11/22/2020 [phone]

## 2020-11-19 NOTE — ED Notes (Signed)
Patient back in room. Girlfriend at bedside. No distress noted at this time.

## 2020-11-19 NOTE — ED Notes (Signed)
Pt discharged home with girlfriend.  VS have been stable.  Discharge instructions reviewed with girlfriend.

## 2020-11-19 NOTE — Consult Note (Signed)
NAME:  Barry Cherry., MRN:  [id number], DOB:  [birth year], LOS: 0 ADMISSION DATE:  November 20, 2020, CONSULTATION DATE:  12/09/2020 REFERRING MD:  Maurice Small, CHIEF COMPLAINT:  altered mental status   History of Present Illness:  Barry Cherry is a 45 year old man who presented to [facility] on 8/5/2 afternoon with altered mental status and reported overdose.  He reportedly (per EMS) snorted an unknown drug.   Initially was given narcan with some improvement.   UDS postive for amphetamines. Was actually discharged on 8/6 from ed, returned in the early afternoon, two hours (per note times) after discharge with worsening mental status ,found unresponsive by his girlfriend who has taken him home.  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  Intubated for airway protection at [facility] prior to transfer to [facility] neurosurgery.  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    Pertinent  Medical History  Anxiety Depression HTN MI history, CAD Tobacco use  Meds: asa, sertraline klonopin, ibuprofen, tylenol, naproxen  Significant Hospital Events: Including procedures, antibiotic start and stop dates in addition to other pertinent events     Interim History / Subjective:    Objective   Blood pressure (!) 119/7, pulse (!) 102, temperature 97.8 F (36.6 C), temperature source Oral, resp. rate 18, SpO2 100 %.    Vent Mode: PRVC FiO2 (%):  [40 %-60 %] 40 % Set Rate:  [18 bmp] 18 bmp Vt Set:  [450 mL] 450 mL PEEP:  [5 cmH20] 5 cmH20 Plateau Pressure:  [16 cmH20-17 cmH20] 17 cmH20  No intake or output data in the 24 hours ending Nov 20, 2020 2044 There were no vitals filed for this visit.  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  Intubated for airway protection at [facility].    AVM, IVH, Subarachnoid hemorrhage:  -EVD placed at bedside, opening pressure high, EVD @ +15cm H2O -OR vs endo suite in AM for coiling vs clipping w/ Dr. Chancy Milroy Goal BP < 160.  Nimodipine ordered, actually off now, BP normal.  Monitor closely.  Consider art line if BP not remaining stable.  Cont mech vent for airway protection.     Transaminase elevation. mild Monitor  Leukocytosis - monitor closely. No other signs of infection  Best Practice (right click and "Reselect all SmartList Selections" daily)   Diet/type: NPO DVT prophylaxis: not indicated GI prophylaxis: PPI Lines: N/A Foley:  N/A Code Status:  full code Last date of multidisciplinary goals of care discussion []   Labs   CBC: Recent Labs  Lab 12/14/2020 1420 11/25/2020 2000  WBC 21.0*  --   NEUTROABS 16.1*  --   HGB 16.8 16.0  HCT 47.6 47.0  MCV 95.6  --   PLT 188  --     Basic Metabolic Panel: Recent Labs  Lab Nov 20, 2020 1420 12/06/2020 2000  NA 143 145  K 3.8 3.9  CL 106  --   CO2 27  --   GLUCOSE 193*  --   BUN 21*  --   CREATININE 0.97  --  CALCIUM 9.3  --    GFR: Estimated Creatinine Clearance: 77.9 mL/min (by C-G formula based on SCr of 0.97 mg/dL). Recent Labs  Lab 11/17/2020 1420  WBC 21.0*    Liver Function Tests: Recent Labs  Lab 12/09/2020 1420  AST 97*  ALT 102*  ALKPHOS 126  BILITOT 1.5*  PROT 7.8  ALBUMIN 3.7   No results for input(s): LIPASE, AMYLASE in the last 168 hours. No results for input(s): AMMONIA in the last 168 hours.  ABG    Component Value Date/Time   PHART 7.449 11/25/2020 2000   PCO2ART 39.9 12/06/2020 2000   PO2ART 220 (H) 12/03/2020 2000   HCO3  27.8 11/22/2020 2000   TCO2 29 11/29/2020 2000   O2SAT 100.0 11/22/2020 2000     Coagulation Profile: Recent Labs  Lab 11/24/2020 1420  INR 1.1    Cardiac Enzymes: Recent Labs  Lab 12/11/2020 1420  CKTOTAL 256    HbA1C: No results found for: HGBA1C  CBG: Recent Labs  Lab 12/09/2020 0815 11/18/2020 1418  GLUCAP 96 160*    Review of Systems:   Unable to assess   Past Medical History:  He,  has a past medical history of Anxiety, Depression, Hypertension, and MI (myocardial infarction) [facility]) (Feb 2014).   Surgical History:   Past Surgical History:  Procedure Laterality Date   carpal tunnel release surgery       Social History:   reports that he has been smoking cigarettes. He has a 20.00 pack-year smoking history. He has never used smokeless tobacco. He reports current alcohol use. He reports that he does not use drugs.   Family History:  His family history is not on file.   Allergies Allergies  Allergen Reactions   Penicillins Anaphylaxis   Tramadol Other (See Comments)    Urinary Sx   Wellbutrin [Bupropion] Other (See Comments)    Insomnia, hallucinations, sob     Home Medications  Prior to Admission medications   Medication Sig Start Date End Date Taking? Authorizing Provider  ibuprofen (ADVIL,MOTRIN) 200 MG tablet Take 800 mg by mouth every 6 (six) hours as needed for headache.   Yes [provider]     Critical care time: 45 minutes

## 2020-11-19 NOTE — ED Notes (Signed)
Report given to [facility] at this time 

## 2020-11-19 NOTE — ED Notes (Signed)
Unable to contact any family or friends to arrange transportation home.

## 2020-11-19 NOTE — ED Provider Notes (Addendum)
Patient signed out to me at 7 AM.  Presented yesterday evening after presumed opiate overdose status post Narcan in the field.  Has been sleeping since.  On my evaluation the patient groans to stimulus does not open his eyes.  We will get an Accu-Chek and continue to encourage awakening.  If not returning to baseline we will have to pursue a broader work-up.  On reevaluation patient is much more awake, moving all extremities, still unwilling to tell me what happened last night and does not have much motivation to get up.  He is able to tell me that he was not trying to hurt himself.  His UDS is positive for amphetamines.  At this point I feel that he is most likely just coming down from what ever substances he used and I have low suspicion for any medical pathology.  Patient is alert and stable for discharge. Pt's girl friend picked him up- pt able to ambulate with steady gate.    Georga Hacking, MD 2020/12/04 [phone]    Georga Hacking, MD 2020-12-04 [phone]    Georga Hacking, MD [phone]    Georga Hacking, MD 2020/12/04 1340

## 2020-11-19 NOTE — H&P (Addendum)
Neurosurgery H&P  CC: altered mental status  HPI: This is a 45 y.o. man that presented to an OSH with altered mental status that responded to naloxone after snorting an unknown substance. He reportedly improved and was discharged to return with worsening AMS. He was intubated at OSH due to depressed mental status, no further hx is therefore available from the patient.   ROS: A 14 point ROS was performed and is negative except as noted in the HPI but limited due to pt's depressed mental status  PMHx:  Past Medical History:  Diagnosis Date   Anxiety    Depression    Hypertension    MI (myocardial infarction) [facility]) Feb 2014   FamHx: No family history on file. SocHx: active smoker, prior tox screens positive for opiates, current tox screen positive for methamphetamines  Exam: Vital signs in last 24 hours: Temp:  [97.7 F (36.5 C)-99 F (37.2 C)] 97.7 F (36.5 C) (08/06 1826) Pulse Rate:  [55-124] 95 (08/06 1900) Resp:  [16-23] 18 (08/06 1900) BP: (109-166)/(67-116) 109/67 (08/06 1900) SpO2:  [97 %-100 %] 100 % (08/06 1900) FiO2 (%):  [50 %-60 %] 50 % (08/06 1826) Weight:  [56.7 kg] 56.7 kg (08/06 1415) General: Lying in hospital bed, appears acutely ill Head: Normocephalic and atruamatic HEENT: +neck stiffness Pulmonary: intubated, good chest rise bilaterally Cardiac: RRR Abdomen: S NT ND Extremities: Warm and well perfused x4, skin w/ tattoos Neuro: Intubated, pupils pinpoint and minimally reactive, gaze conjugate, w/d x4, not FC   Assessment and Plan: 45 y.o. man w/ progressive AMS. 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 Hunt-Hess 3 at presentation before hydrocephalus.  -EVD placed at bedside, opening pressure high, EVD @ +15cm H2O -will consult CCM -OR vs endo suite in AM for coiling vs clipping w/ Dr. Conchita Paris -nimodipine, SBP<160  Jadene Pierini, MD 11/22/2020 7:24 PM [facility]

## 2020-11-19 NOTE — ED Provider Notes (Signed)
[facility]  ____________________________________________   Event Date/Time   First MD Initiated Contact with Patient 11/28/2020 1407     (approximate)  I have reviewed the triage vital signs and the nursing notes.   HISTORY  Chief Complaint Altered Mental Status    HPI Barry Cherry. is a 45 y.o. male past medical history of CAD, hypertension, depression who presents with altered mental status.  This patient was seen earlier on my shift.  He came to the emergency department last night after apparently he had smoked some substance and was found down and received Narcan with improvement in his respiratory status.  He was observed overnight and for the morning part of my shift and was then discharged after he appeared to have an improved mental status and was able to ambulate.  His girlfriend brings him back now because his mental status is not normal.   Patient's girlfriend tells me that she found him at home unresponsive.  She is not sure of any prior drug history.      Past Medical History:  Diagnosis Date   Anxiety    Depression    Hypertension    MI (myocardial infarction) [facility]) Feb 2014    Patient Active Problem List   Diagnosis Date Noted   CAD (coronary artery disease) 09/14/2012   HTN (hypertension) 09/14/2012   Tobacco use 09/14/2012   Anxiety 09/14/2012   Depression 09/14/2012    Past Surgical History:  Procedure Laterality Date   carpal tunnel release surgery      Prior to Admission medications   Medication Sig Start Date End Date Taking? Authorizing Provider  acetaminophen (TYLENOL) 500 MG tablet Take 500 mg by mouth every 6 (six) hours as needed for pain. Patient not taking: Reported on 12/09/2020    [provider]  aspirin 81 MG tablet Take 81 mg by mouth daily. Patient not taking: No sig reported    [provider]  B Complex Vitamins (VITAMIN B COMPLEX PO) Take 1 tablet by mouth daily. Patient not  taking: No sig reported    [provider]  clonazePAM (KLONOPIN) 0.5 MG tablet Take 0.5-1 tablets (0.25-0.5 mg total) by mouth at bedtime as needed for anxiety. Need office visit for additional refills. Patient not taking: No sig reported 10/16/12   Porfirio Oar, PA  ibuprofen (ADVIL,MOTRIN) 200 MG tablet Take 400 mg by mouth every 6 (six) hours as needed for moderate pain. Patient not taking: No sig reported    [provider]  naproxen (NAPROSYN) 500 MG tablet Take 1 tablet (500 mg total) by mouth 2 (two) times daily. Patient not taking: No sig reported 04/11/14   Azalia Bilis, MD  sertraline (ZOLOFT) 50 MG tablet Take 25 mg by mouth daily. Patient not taking: No sig reported 09/14/12   Porfirio Oar, PA    Allergies Penicillins, Tramadol, and Wellbutrin [bupropion]  No family history on file.  Social History Social History   Tobacco Use   Smoking status: Every Day    Packs/day: 1.00    Years: 20.00    Pack years: 20.00    Types: Cigarettes   Smokeless tobacco: Never  Substance Use Topics   Alcohol use: Yes    Comment: occ.   Drug use: No    Review of Systems   Review of Systems  Unable to perform ROS: Mental status change   Physical Exam Updated Vital Signs BP (!) 157/116   Pulse (!) 124   Temp 99  F (37.2 C) (Axillary)   Resp (!) 22   Ht 5' 2.99" (1.6 m)   Wt 56.7 kg   SpO2 100%   BMI 22.15 kg/m   Physical Exam Vitals and nursing note reviewed.  Constitutional:      General: He is not in acute distress.    Appearance: Normal appearance.     Comments: Patient is resting in bed with his eyes closed  HENT:     Head: Normocephalic and atraumatic.  Eyes:     General: No scleral icterus.    Conjunctiva/sclera: Conjunctivae normal.     Comments: Pupils are pinpoint bilaterally  Neck:     Comments: No Cervical spine tenderness Pulmonary:     Effort: Pulmonary effort is normal. No respiratory distress.     Breath sounds: Normal breath  sounds. No wheezing.  Abdominal:     General: Abdomen is flat.     Tenderness: There is no abdominal tenderness. There is no guarding.  Musculoskeletal:        General: No deformity or signs of injury.     Cervical back: Normal range of motion.  Skin:    Coloration: Skin is not jaundiced or pale.  Neurological:     Comments: Patient's eyes are closed, he groans and localizes to pain Does not answer questions Moving all extremities spontaneously  Psychiatric:     Comments: Unable to assess due to altered mental status     LABS (all labs ordered are listed, but only abnormal results are displayed)  Labs Reviewed  CBC WITH DIFFERENTIAL/PLATELET - Abnormal; Notable for the following components:      Result Value   WBC 21.0 (*)    Neutro Abs 16.1 (*)    Monocytes Absolute 1.1 (*)    Eosinophils Absolute 0.6 (*)    Abs Immature Granulocytes 0.14 (*)    All other components within normal limits  COMPREHENSIVE METABOLIC PANEL - Abnormal; Notable for the following components:   Glucose, Bld 193 (*)    BUN 21 (*)    AST 97 (*)    ALT 102 (*)    Total Bilirubin 1.5 (*)    All other components within normal limits  CBG MONITORING, ED - Abnormal; Notable for the following components:   Glucose-Capillary 160 (*)    All other components within normal limits  CK  PROTIME-INR  APTT   ____________________________________________  EKG  Sinus tachycardia with prolonged QT interval, right axis deviation, biatrial enlargement ____________________________________________  RADIOLOGY I, Randol Kern, personally viewed and evaluated these images (plain radiographs) as part of my medical decision making, as well as reviewing the written report by the radiologist.  ED MD interpretation: CT head shows a large subarachnoid hemorrhage with intraventricular extension    ____________________________________________   PROCEDURES  Procedure(s) performed (including Critical  Care):  .Critical Care  Date/Time: 11/14/2020 4:21 PM Performed by: Georga Hacking, MD Authorized by: Georga Hacking, MD   Critical care provider statement:    Critical care time (minutes):  45   Critical care was necessary to treat or prevent imminent or life-threatening deterioration of the following conditions:  CNS failure or compromise   Critical care was time spent personally by me on the following activities:  Development of treatment plan with patient or surrogate, discussions with consultants, evaluation of patient's response to treatment, examination of patient, obtaining history from patient or surrogate, ordering and performing treatments and interventions, ordering and review of laboratory studies, pulse oximetry, ordering and  review of radiographic studies and ventilator management   I assumed direction of critical care for this patient from another provider in my specialty: no     Care discussed with: accepting provider at another facility   Procedure Name: Intubation Date/Time: 12/01/20 4:22 PM Performed by: Georga Hacking, MD Pre-anesthesia Checklist: Patient identified, Patient being monitored, Emergency Drugs available, Timeout performed and Suction available Oxygen Delivery Method: Non-rebreather mask Preoxygenation: Pre-oxygenation with 100% oxygen Induction Type: Rapid sequence Ventilation: Mask ventilation without difficulty Grade View: Grade I Tube size: 7.5 mm Number of attempts: 1 Placement Confirmation: ETT inserted through vocal cords under direct vision, CO2 detector and Breath sounds checked- equal and bilateral      ____________________________________________   INITIAL IMPRESSION / ASSESSMENT AND PLAN / ED COURSE  Patient is a 45 year old male who presents with altered mental status.  Observed in the ED for prolonged period after presumed opiate overdose last pm and then discharged, now returning with worsening mental status.  On my  evaluation patient is somnolent, will open his eyes and does localize to pain but does not provide any meaningful local response.  Taken immediately to CT head which shows a large subarachnoid hemorrhage with intraventricular extension.  Neurosurgery at [facility] called immediately who recommends discussing with [facility] neurosurgery for transfer.  Patient accepted by neuro ICU at [facility].  The patient was intubated for airway protection during transfer as his mental status was poor but at the time, although he was still protecting his airway.  Sedated with propofol and fentanyl.  Nicardipine drip ordered for blood pressure systolic goal less than 140.  The patient's girlfriend Gigi Gin, who is at bedside, was updated about the change in his status as well as the etiology of his presentation.    ____________________________________________   FINAL CLINICAL IMPRESSION(S) / ED DIAGNOSES  Final diagnoses:  Subarachnoid hemorrhage [facility])     ED Discharge Orders     None        Note:  This document was prepared using Dragon voice recognition software and may include unintentional dictation errors.    Georga Hacking, MD December 01, 2020 [phone]

## 2020-11-20 ENCOUNTER — Inpatient Hospital Stay (HOSPITAL_COMMUNITY): Payer: Medicaid Other | Admitting: Certified Registered Nurse Anesthetist

## 2020-11-20 ENCOUNTER — Inpatient Hospital Stay (HOSPITAL_COMMUNITY): Payer: Medicaid Other

## 2020-11-20 ENCOUNTER — Encounter (HOSPITAL_COMMUNITY)
Admission: AD | Disposition: E | Payer: Self-pay | Source: Other Acute Inpatient Hospital | Attending: Neurological Surgery

## 2020-11-20 DIAGNOSIS — G929 Unspecified toxic encephalopathy: Secondary | ICD-10-CM

## 2020-11-20 DIAGNOSIS — I6389 Other cerebral infarction: Secondary | ICD-10-CM

## 2020-11-20 DIAGNOSIS — F1910 Other psychoactive substance abuse, uncomplicated: Secondary | ICD-10-CM

## 2020-11-20 DIAGNOSIS — J9601 Acute respiratory failure with hypoxia: Secondary | ICD-10-CM

## 2020-11-20 DIAGNOSIS — I608 Other nontraumatic subarachnoid hemorrhage: Secondary | ICD-10-CM

## 2020-11-20 HISTORY — PX: IR ANGIO VERTEBRAL SEL VERTEBRAL UNI L MOD SED: IMG5367

## 2020-11-20 HISTORY — PX: RADIOLOGY WITH ANESTHESIA: SHX6223

## 2020-11-20 HISTORY — PX: IR ANGIO INTRA EXTRACRAN SEL INTERNAL CAROTID BILAT MOD SED: IMG5363

## 2020-11-20 HISTORY — PX: IR TRANSCATH/EMBOLIZ: IMG695

## 2020-11-20 LAB — ECHOCARDIOGRAM COMPLETE
Area-P 1/2: 3.68 cm2
Height: 62.992 in
S' Lateral: 1.5 cm
Weight: 2000.01 oz

## 2020-11-20 LAB — SURGICAL PCR SCREEN
MRSA, PCR: NEGATIVE
Staphylococcus aureus: POSITIVE — AB

## 2020-11-20 LAB — ABO/RH: ABO/RH(D): O NEG

## 2020-11-20 LAB — TRIGLYCERIDES: Triglycerides: 136 mg/dL (ref <150)

## 2020-11-20 LAB — SARS CORONAVIRUS 2 (TAT 6-24 HRS): SARS Coronavirus 2: NEGATIVE

## 2020-11-20 SURGERY — IR WITH ANESTHESIA
Anesthesia: General

## 2020-11-20 MED ORDER — ORAL CARE MOUTH RINSE
15.0000 mL | OROMUCOSAL | Status: DC
  Administered 2020-11-20 – 2020-11-23 (×35): 15 mL via OROMUCOSAL

## 2020-11-20 MED ORDER — FENTANYL 2500MCG IN NS 250ML (10MCG/ML) PREMIX INFUSION
0.0000 ug/h | INTRAVENOUS | Status: DC
  Administered 2020-11-20 – 2020-11-22 (×3): 100 ug/h via INTRAVENOUS
  Filled 2020-11-20 (×2): qty 250

## 2020-11-20 MED ORDER — CHLORHEXIDINE GLUCONATE 0.12% ORAL RINSE (MEDLINE KIT)
15.0000 mL | Freq: Two times a day (BID) | OROMUCOSAL | Status: DC
  Administered 2020-11-20 – 2020-11-23 (×8): 15 mL via OROMUCOSAL

## 2020-11-20 MED ORDER — FENTANYL CITRATE (PF) 100 MCG/2ML IJ SOLN
INTRAMUSCULAR | Status: DC | PRN
  Administered 2020-11-20 (×2): 50 ug via INTRAVENOUS

## 2020-11-20 MED ORDER — IOHEXOL 300 MG/ML  SOLN
50.0000 mL | Freq: Once | INTRAMUSCULAR | Status: AC | PRN
  Administered 2020-11-20: 30 mL via INTRA_ARTERIAL

## 2020-11-20 MED ORDER — ROCURONIUM BROMIDE 10 MG/ML (PF) SYRINGE
PREFILLED_SYRINGE | INTRAVENOUS | Status: DC | PRN
Start: 2020-11-20 — End: 2020-11-20
  Administered 2020-11-20 (×2): 50 mg via INTRAVENOUS

## 2020-11-20 MED ORDER — LACTATED RINGERS IV SOLN: INTRAVENOUS | Status: DC | PRN

## 2020-11-20 MED ORDER — IOHEXOL 300 MG/ML  SOLN
50.0000 mL | Freq: Once | INTRAMUSCULAR | Status: AC | PRN
  Administered 2020-11-20: 20 mL via INTRA_ARTERIAL

## 2020-11-20 MED ORDER — LEVETIRACETAM IN NACL 500 MG/100ML IV SOLN
500.0000 mg | Freq: Two times a day (BID) | INTRAVENOUS | Status: DC
  Administered 2020-11-20 – 2020-11-23 (×7): 500 mg via INTRAVENOUS
  Filled 2020-11-20 (×7): qty 100

## 2020-11-20 MED ORDER — PHENYLEPHRINE HCL-NACL 20-0.9 MG/250ML-% IV SOLN
INTRAVENOUS | Status: DC | PRN
Start: 2020-11-20 — End: 2020-11-20
  Administered 2020-11-20: 25 ug/min via INTRAVENOUS

## 2020-11-20 MED ORDER — MUPIROCIN 2 % EX OINT
1.0000 "application " | TOPICAL_OINTMENT | Freq: Two times a day (BID) | CUTANEOUS | Status: DC
  Administered 2020-11-20 – 2020-11-23 (×7): 1 via NASAL
  Filled 2020-11-20 (×2): qty 22

## 2020-11-20 MED ORDER — ONDANSETRON HCL 4 MG/2ML IJ SOLN
INTRAMUSCULAR | Status: DC | PRN
  Administered 2020-11-20: 4 mg via INTRAVENOUS

## 2020-11-20 MED ORDER — ACETAMINOPHEN 160 MG/5ML PO SOLN
325.0000 mg | Freq: Four times a day (QID) | ORAL | Status: DC | PRN
  Administered 2020-11-20 – 2020-11-22 (×3): 325 mg
  Filled 2020-11-20 (×3): qty 20.3

## 2020-11-20 NOTE — Progress Notes (Signed)
Patient transported to IR on vent. CRNA and RN at bedside.

## 2020-11-20 NOTE — Anesthesia Preprocedure Evaluation (Signed)
Anesthesia Evaluation  Patient identified by MRN, date of birth, ID band Patient unresponsive    Reviewed: Allergy & Precautions, H&P , NPO status , Patient's Chart, lab work & pertinent test results  Airway Mallampati: Intubated       Dental no notable dental hx. (+) Teeth Intact, Dental Advisory Given   Pulmonary Current Smoker and Patient abstained from smoking.,  Intubated   Pulmonary exam normal breath sounds clear to auscultation       Cardiovascular hypertension, + Past MI   Rhythm:Regular Rate:Normal     Neuro/Psych Anxiety Depression CVA, Residual Symptoms    GI/Hepatic negative GI ROS, Neg liver ROS,   Endo/Other  negative endocrine ROS  Renal/GU negative Renal ROS  negative genitourinary   Musculoskeletal   Abdominal   Peds  Hematology negative hematology ROS (+)   Anesthesia Other Findings   Reproductive/Obstetrics negative OB ROS                             Anesthesia Physical Anesthesia Plan  ASA: 4  Anesthesia Plan: General   Post-op Pain Management:    Induction: Intravenous  PONV Risk Score and Plan: 1  Airway Management Planned: Oral ETT  Additional Equipment: Arterial line  Intra-op Plan:   Post-operative Plan: Post-operative intubation/ventilation  Informed Consent: I have reviewed the patients History and Physical, chart, labs and discussed the procedure including the risks, benefits and alternatives for the proposed anesthesia with the patient or authorized representative who has indicated his/her understanding and acceptance.     Dental advisory given  Plan Discussed with: CRNA  Anesthesia Plan Comments:         Anesthesia Quick Evaluation

## 2020-11-20 NOTE — Sedation Documentation (Addendum)
Right femoral sheath removed. Quick clot applied. Pressure applied

## 2020-11-20 NOTE — Brief Op Note (Signed)
  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 Lisbeth Renshaw, MD  ANESTHESIA: GETA  EBL: Minimal  SPECIMENS: None  COMPLICATIONS: None  CONDITION: Stable to ICU  FINDINGS (Full report in [facility]): 1. Successful coil embolization of Acom aneurysm. No residual filling seen post embolization. Bilateral A2 segments remain patent. 2. Infundibulum at the origin of the left posterior communicating artery   Lisbeth Renshaw, MD [facility]

## 2020-11-20 NOTE — Progress Notes (Signed)
  Echocardiogram 2D Echocardiogram has been performed.  Roosvelt Maser F 11/28/2020, 1:19 PM

## 2020-11-20 NOTE — Progress Notes (Signed)
Upon neuro assessment, patient aroused by voice, but did not open eyes, moving all extremities and pulling against restraints to sit up. Upon sitting forward, ventilator tubing popped off. Patient was able to follow simple commands (wiggled toes), but unable to redirect to follow safe measures. Propofol gtt restarted at lower dose

## 2020-11-20 NOTE — Anesthesia Postprocedure Evaluation (Signed)
Anesthesia Post Note  Patient: Barry Cherry.  Procedure(s) Performed: IR WITH ANESTHESIA     Patient location during evaluation: SICU Anesthesia Type: General Level of consciousness: sedated Pain management: pain level controlled Vital Signs Assessment: post-procedure vital signs reviewed and stable Respiratory status: patient remains intubated per anesthesia plan Cardiovascular status: stable Postop Assessment: no apparent nausea or vomiting Anesthetic complications: no   No notable events documented.  Last Vitals:  Vitals:   11/21/2020 1800 11/22/2020 1815  BP: (!) 111/52 107/66  Pulse: 89 89  Resp: 19 18  Temp: 37.8 C 37.9 C  SpO2: 100% 100%    Last Pain:  Vitals:   12/11/2020 0800  TempSrc: Axillary                 Denay Pleitez,W. EDMOND

## 2020-11-20 NOTE — Progress Notes (Signed)
Neurosurgery Service Progress Note  Subjective: No acute events overnight, EVD draining well   Objective: Vitals:   06-Dec-2020 0600 12-06-20 0700 2020/12/06 0742 Dec 06, 2020 0800  BP: 107/62 108/64  106/66  Pulse: 83 85  88  Resp: 18 18  18   Temp:    (!) 97.5 F (36.4 C)  TempSrc:    Axillary  SpO2: 99% 99% 99% 99%    Physical Exam: Intubated, sedation paused, pupils reactive, gaze w/ forced upgaze, +c/c/g, w/d x4  Assessment & Plan: 45 y.o. man w/ ruptured Acomm ASAH, HCP s/p R F EVD.  -cont EVD at +15 until secured -going for coiling today -PCCM recs -cont standard aSAH protocol w/ nimodipine, SBP<160 -SCDs/TEDs  59  12-06-20 10:24 AM

## 2020-11-20 NOTE — Progress Notes (Signed)
  NEUROSURGERY PROGRESS NOTE   No issues overnight. Pt remains intubated, sedated. Hx reviewed in EMR and with Dr. Maurice Small and pts father. 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  No known PMH. Occasional tobacco smoker. No FHx of aneurysms or ICH.  EXAM:  BP 110/69   Pulse 82   Temp (!) 101.3 F (38.5 C)   Resp 18   SpO2 100%   Intubated Pupils reactive (+) cough/gag W/d all extremities  IMAGING: CT demonstrates basal SAH with orbitofrontal ICH and HCP  CTA demonstrates leftward projecting right A1-2 junction aneurysm. Also ?small left Pcom aneurysm vs infundibulum. No significant cervical carotid disease.  IMPRESSION:  44 y.o. 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 I reviewed the possible treatment options for intracranial aneurysms including endovascular coiling and open clip ligation. The risks of the angiogram, coiling, and surgical clipping were also reviewed to include stroke and aneurysm re-rupture leading to weakness/paralysis/coma/death, arterial dissection, contrast nephropathy, groin hematoma, infection, SZ, hydrocephalus.  The patient's father appeared to understand our discussion and he  provided consent to proceed with diagnostic angiogram and the appropriate treatment for  aneurysm.    Lisbeth Renshaw, MD [facility]

## 2020-11-20 NOTE — Progress Notes (Signed)
RT assisted with transportation of this pt from IR to 4N24 while on full ventilatory support. Pt tolerated well with no complaints and SVS. RN at bedside at this time.

## 2020-11-20 NOTE — Anesthesia Procedure Notes (Signed)
Arterial Line Insertion Start/End2022/09/01 3:50 PM, 12/15/20 3:52 PM Performed by: Edmonia Caprio, CRNA, CRNA  Patient location: OOR procedure area. Preanesthetic checklist: patient identified, IV checked, site marked, risks and benefits discussed, surgical consent, monitors and equipment checked, pre-op evaluation, timeout performed and anesthesia consent Left, radial was placed Catheter size: 20 G Hand hygiene performed  and maximum sterile barriers used   Attempts: 1 Procedure performed without using ultrasound guided technique. Following insertion, dressing applied. Post procedure assessment: normal and unchanged  Patient tolerated the procedure well with no immediate complications.

## 2020-11-20 NOTE — Transfer of Care (Addendum)
Immediate Anesthesia Transfer of Care Note  Patient: Barry Cherry.  Procedure(s) Performed: IR WITH ANESTHESIA  Patient Location: SICU  Anesthesia Type:General  Level of Consciousness: sedated, unresponsive and Patient remains intubated per anesthesia plan  Airway & Oxygen Therapy: Patient remains intubated per anesthesia plan and Patient placed on Ventilator (see vital sign flow sheet for setting)  Post-op Assessment: Report given to RN and Post -op Vital signs reviewed and stable  Post vital signs: Reviewed and stable. Ventric drain re-leveled and re-opened per Dr. Val Riles verbal orders.  Last Vitals:  Vitals Value Taken Time  BP 109/69 12/11/2020 1756  Temp 37.8 C 11/22/2020 1759  Pulse 90 11/21/2020 1759  Resp 18 12/14/2020 1759  SpO2 100 % 11/19/2020 1759  Vitals shown include unvalidated device data.  Last Pain:  Vitals:   11/30/2020 0800  TempSrc: Axillary         Complications: No notable events documented.

## 2020-11-20 NOTE — Consult Note (Signed)
NAME:  Barry Bondar., MRN:  [id number], DOB:  1976/03/29, LOS: 1 ADMISSION DATE:  12/18/2020, CONSULTATION DATE:  2020-12-18 REFERRING MD:  Maurice Small, CHIEF COMPLAINT:  altered mental status   History of Present Illness:  Mr. Strohm is a 45 year old man who presented to [facility] on 8/5/2 afternoon with altered mental status and reported overdose.  He reportedly (per EMS) snorted an unknown drug.   Initially was given narcan with some improvement.   UDS postive for amphetamines. Was actually discharged on 8/6 from ed, returned in the early afternoon, two hours (per note times) after discharge with worsening mental status ,found unresponsive by his girlfriend who has taken him home.  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  Intubated for airway protection at [facility] prior to transfer to [facility] neurosurgery.  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    Significant Hospital Events: Including procedures, antibiotic start and stop dates in addition to other pertinent events   8/6: Admitted, intubated, EVD placed  Interim History / Subjective:  No overnight issues, remained afebrile EVD was placed after CT scan showed hydrocephalus, currently at 15, draining well  Objective   Blood pressure 118/73, pulse 93, temperature (!) 100.4 F (38 C), resp. rate 18, SpO2 99 %.    Vent Mode: PRVC FiO2 (%):  [40 %-60 %] 40 % Set Rate:  [18 bmp] 18 bmp Vt Set:  [450 mL] 450 mL PEEP:  [5 cmH20] 5 cmH20 Plateau Pressure:  [15 cmH20-18 cmH20] 16 cmH20   Intake/Output Summary (Last 24 hours) at 11/24/2020 1132 Last data filed at 12/05/2020 1100 Gross per 24 hour  Intake 1582.77 ml  Output 736 ml  Net 846.77 ml   There were no vitals filed for this visit.  Examination:   Physical  exam: General: Crtitically ill-appearing male, orally intubated HEENT: Atraumatic, eyes anicteric.  ETT and OGT in place Neuro: Eyes closed, not following commands, pupils 3 mm bilateral reactive to light, positive cough and gag.  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 Acute hydrocephalus s/p EVD Hypertension Continue neuro watch every hour Appreciate neurosurgery input CTA head and neck confirmed anterior communicating artery aneurysm, patient is going for coil embolization today SBP goal less than 160 CT head showed hydrocephalus, EVD is in place at 15 cm of water draining well Continue nimodipine Echocardiogram Continue Keppra for seizure prophylaxis Maintain euvolemia  Acute hypoxic respiratory failure due to subarachnoid hemorrhage Acute encephalopathy due to subarachnoid hemorrhage and polysubstance abuse Continue lung protective ventilation VAP bundle Continue sedation for now until aneurysm is secured Once aneurysm is secured we will try to come down on sedation and try to extubate  Polysubstance abuse Patient is tobacco smoker U tox is positive for amphetamine Watch for signs of withdrawal   Best Practice (right click and "Reselect all SmartList Selections" daily)   Diet/type: NPO DVT prophylaxis: SCD GI prophylaxis: PPI Lines: N/A Foley:  N/A Code Status:  full code Last date of multidisciplinary goals of care discussion [Per primary  team]  Labs   CBC: Recent Labs  Lab 11/28/2020 1420 11-28-2020 2000  WBC 21.0*  --   NEUTROABS 16.1*  --   HGB 16.8 16.0  HCT 47.6 47.0  MCV 95.6  --   PLT 188  --     Basic  Metabolic Panel: Recent Labs  Lab 11-28-2020 1420 28-Nov-2020 2000  NA 143 145  K 3.8 3.9  CL 106  --   CO2 27  --   GLUCOSE 193*  --   BUN 21*  --   CREATININE 0.97  --   CALCIUM 9.3  --    GFR: Estimated Creatinine Clearance: 77.9 mL/min (by C-G formula based on SCr of 0.97 mg/dL). Recent Labs  Lab November 28, 2020 1420  WBC 21.0*    Liver Function Tests: Recent Labs  Lab 28-Nov-2020 1420  AST 97*  ALT 102*  ALKPHOS 126  BILITOT 1.5*  PROT 7.8  ALBUMIN 3.7   No results for input(s): LIPASE, AMYLASE in the last 168 hours. No results for input(s): AMMONIA in the last 168 hours.  ABG    Component Value Date/Time   PHART 7.449 11/28/2020 2000   PCO2ART 39.9 November 28, 2020 2000   PO2ART 220 (H) 2020-11-28 2000   HCO3 27.8 11/28/2020 2000   TCO2 29 November 28, 2020 2000   O2SAT 100.0 2020/11/28 2000     Coagulation Profile: Recent Labs  Lab 2020-11-28 1420  INR 1.1    Cardiac Enzymes: Recent Labs  Lab November 28, 2020 1420  CKTOTAL 256    HbA1C: No results found for: HGBA1C  CBG: Recent Labs  Lab 11-28-2020 0815 11/28/2020 1418  GLUCAP 96 160*     Total critical care time: 51 minutes  Performed by: Cheri Fowler   Critical care time was exclusive of separately billable procedures and treating other patients.   Critical care was necessary to treat or prevent imminent or life-threatening deterioration.   Critical care was time spent personally by me on the following activities: development of treatment plan with patient and/or surrogate as well as nursing, discussions with consultants, evaluation of patient's response to treatment, examination of patient, obtaining history from patient or surrogate, ordering and performing treatments and interventions, ordering and review of laboratory studies, ordering and review of radiographic studies, pulse oximetry and re-evaluation of patient's condition.   Cheri Fowler MD [facility] See Amion for pager If no response  to pager, please call [phone] until 7pm After 7pm, Please call E-link [phone]

## 2020-11-21 ENCOUNTER — Inpatient Hospital Stay (HOSPITAL_COMMUNITY): Payer: Medicaid Other

## 2020-11-21 ENCOUNTER — Encounter (HOSPITAL_COMMUNITY): Payer: Self-pay | Admitting: Neurosurgery

## 2020-11-21 DIAGNOSIS — I609 Nontraumatic subarachnoid hemorrhage, unspecified: Secondary | ICD-10-CM

## 2020-11-21 DIAGNOSIS — N179 Acute kidney failure, unspecified: Secondary | ICD-10-CM

## 2020-11-21 LAB — POCT I-STAT 7, (LYTES, BLD GAS, ICA,H+H)
Acid-base deficit: 4 mmol/L — ABNORMAL HIGH (ref 0.0–2.0)
Bicarbonate: 22.8 mmol/L (ref 20.0–28.0)
Calcium, Ion: 1.07 mmol/L — ABNORMAL LOW (ref 1.15–1.40)
HCT: 44 % (ref 39.0–52.0)
Hemoglobin: 15 g/dL (ref 13.0–17.0)
O2 Saturation: 95 %
Patient temperature: 37.6
Potassium: 4.3 mmol/L (ref 3.5–5.1)
Sodium: 149 mmol/L — ABNORMAL HIGH (ref 135–145)
TCO2: 24 mmol/L (ref 22–32)
pCO2 arterial: 46.3 mmHg (ref 32.0–48.0)
pH, Arterial: 7.303 — ABNORMAL LOW (ref 7.350–7.450)
pO2, Arterial: 89 mmHg (ref 83.0–108.0)

## 2020-11-21 LAB — COMPREHENSIVE METABOLIC PANEL
ALT: 70 U/L — ABNORMAL HIGH (ref 0–44)
AST: 88 U/L — ABNORMAL HIGH (ref 15–41)
Albumin: 2.5 g/dL — ABNORMAL LOW (ref 3.5–5.0)
Alkaline Phosphatase: 88 U/L (ref 38–126)
Anion gap: 10 (ref 5–15)
BUN: 27 mg/dL — ABNORMAL HIGH (ref 6–20)
CO2: 20 mmol/L — ABNORMAL LOW (ref 22–32)
Calcium: 7.5 mg/dL — ABNORMAL LOW (ref 8.9–10.3)
Chloride: 114 mmol/L — ABNORMAL HIGH (ref 98–111)
Creatinine, Ser: 1.31 mg/dL — ABNORMAL HIGH (ref 0.61–1.24)
GFR, Estimated: >60 mL/min (ref >60)
Glucose, Bld: 116 mg/dL — ABNORMAL HIGH (ref 70–99)
Potassium: 4.1 mmol/L (ref 3.5–5.1)
Sodium: 144 mmol/L (ref 135–145)
Total Bilirubin: 2 mg/dL — ABNORMAL HIGH (ref 0.3–1.2)
Total Protein: 5.8 g/dL — ABNORMAL LOW (ref 6.5–8.1)

## 2020-11-21 LAB — TYPE AND SCREEN
ABO/RH(D): O NEG
Antibody Screen: NEGATIVE

## 2020-11-21 LAB — PHOSPHORUS: Phosphorus: 4.8 mg/dL — ABNORMAL HIGH (ref 2.5–4.6)

## 2020-11-21 LAB — CBC
HCT: 44 % (ref 39.0–52.0)
Hemoglobin: 14.6 g/dL (ref 13.0–17.0)
MCH: 32.9 pg (ref 26.0–34.0)
MCHC: 33.2 g/dL (ref 30.0–36.0)
MCV: 99.1 fL (ref 80.0–100.0)
Platelets: 145 10*3/uL — ABNORMAL LOW (ref 150–400)
RBC: 4.44 MIL/uL (ref 4.22–5.81)
RDW: 14.7 % (ref 11.5–15.5)
WBC: 22.5 10*3/uL — ABNORMAL HIGH (ref 4.0–10.5)
nRBC: 0 % (ref 0.0–0.2)

## 2020-11-21 LAB — HEMOGLOBIN A1C
Hgb A1c MFr Bld: 5.3 % (ref 4.8–5.6)
Mean Plasma Glucose: 105.41 mg/dL

## 2020-11-21 LAB — GLUCOSE, CAPILLARY
Glucose-Capillary: 162 mg/dL — ABNORMAL HIGH (ref 70–99)
Glucose-Capillary: 207 mg/dL — ABNORMAL HIGH (ref 70–99)
Glucose-Capillary: 292 mg/dL — ABNORMAL HIGH (ref 70–99)

## 2020-11-21 LAB — MAGNESIUM: Magnesium: 1.9 mg/dL (ref 1.7–2.4)

## 2020-11-21 MED ORDER — SODIUM CHLORIDE 0.9 % IV SOLN
250.0000 mL | INTRAVENOUS | Status: DC
  Administered 2020-11-22: 250 mL via INTRAVENOUS

## 2020-11-21 MED ORDER — LEVOFLOXACIN IN D5W 750 MG/150ML IV SOLN
750.0000 mg | INTRAVENOUS | Status: DC
  Administered 2020-11-21 – 2020-11-22 (×2): 750 mg via INTRAVENOUS
  Filled 2020-11-21 (×2): qty 150

## 2020-11-21 MED ORDER — PHENYLEPHRINE HCL-NACL 20-0.9 MG/250ML-% IV SOLN
25.0000 ug/min | INTRAVENOUS | Status: DC
Start: 2020-11-21 — End: 2020-11-22
  Administered 2020-11-21: 25 ug/min via INTRAVENOUS
  Administered 2020-11-22: 200 ug/min via INTRAVENOUS
  Administered 2020-11-22: 160 ug/min via INTRAVENOUS
  Filled 2020-11-21 (×3): qty 250

## 2020-11-21 MED ORDER — INSULIN ASPART 100 UNIT/ML IJ SOLN
0.0000 [IU] | INTRAMUSCULAR | Status: DC
  Administered 2020-11-21 – 2020-11-22 (×2): 3 [IU] via SUBCUTANEOUS
  Administered 2020-11-22: 5 [IU] via SUBCUTANEOUS
  Administered 2020-11-22 (×3): 3 [IU] via SUBCUTANEOUS

## 2020-11-21 MED ORDER — LACTATED RINGERS IV BOLUS
1000.0000 mL | Freq: Once | INTRAVENOUS | Status: AC
  Administered 2020-11-21: 1000 mL via INTRAVENOUS

## 2020-11-21 MED ORDER — PROSOURCE TF PO LIQD
45.0000 mL | Freq: Every day | ORAL | Status: DC
  Administered 2020-11-21 – 2020-11-23 (×3): 45 mL
  Filled 2020-11-21 (×3): qty 45

## 2020-11-21 MED ORDER — OSMOLITE 1.5 CAL PO LIQD
1000.0000 mL | ORAL | Status: DC
  Administered 2020-11-21 – 2020-11-22 (×2): 1000 mL

## 2020-11-21 NOTE — Progress Notes (Signed)
Patient ID: Barry Millea., male   DOB: [birth year], 45 y.o.   MRN: [id number]   NAME:  Barry Murnane., MRN:  [id number], DOB:  1976-01-15, LOS: 2 ADMISSION DATE:  11-21-20, CONSULTATION DATE:  11-21-2020 REFERRING MD:  Maurice Small, CHIEF COMPLAINT:  AMS   History of Present Illness:  Barry Cherry is a 45 year old man who presented to [facility] on 8/5/2 afternoon with altered mental status and reported overdose.  He reportedly (per EMS) snorted an unknown drug.   Initially was given narcan with some improvement.   UDS postive for amphetamines. Was actually discharged on 8/6 from ed, returned in the early afternoon, two hours (per note times) after discharge with worsening mental status ,found unresponsive by his girlfriend who has taken him home. 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  Intubated for airway protection at [facility] prior to transfer to [facility] neurosurgery. Nicardipine started for SBP control Per neurosurgery note: "CTA shows Acomm aneurysm, possible Pcomm vs infundibulum. Hunt-Hess 3 at presentation before hydrocephalus."   EVD placed by neurosurgeon at the bedside  Pertinent  Medical History  Polysubstance use with UDS + for amphetamines   Significant Hospital Events: Including procedures, antibiotic start and stop dates in addition to other pertinent events   8/6: admitted, intubated, EVD placed 8/7: aneurysmal coiling completed 8/8; d/c sedation   Interim History / Subjective:  Went for aneurysmal coiling last night. Has had continued fevers. BP at goal overnight.   Objective   Blood pressure (!) 123/55, pulse 73, temperature 99.14 F (37.3 C), resp. rate 18, SpO2 92 %.    Vent Mode: PRVC FiO2 (%):  [40 %] 40 % Set Rate:  [18 bmp] 18 bmp Vt Set:  [450 mL] 450 mL PEEP:  [5 cmH20] 5 cmH20 Plateau Pressure:  [16 cmH20-19 cmH20] 18 cmH20   Intake/Output Summary (Last 24 hours) at 11/21/2020 0825 Last data filed at 11/21/2020 0800 Gross per  24 hour  Intake 3816.08 ml  Output 1759 ml  Net 2057.08 ml   There were no vitals filed for this visit.  Examination: General: Thin appearing middle aged male. Lying in bed mechanically intubated.  HENT: Incision just to the right of midline of skull from surgery. No surrounding edema, redness, or drainage.  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 Small pericardial effusion also present posterior to LV.  No intracardiac source of embolism identified.  - outpatient cardiology follow up   Hyperchloremic acidosis  Likely secondary to continuous NS infusion.  - d/c continuous NS infusion - LR bolus PRN  AKI  Creatinine increased  to 1.31 this morning from 0.97. Possibly due to keppra, though not common. Most likely due to continuous NS infusion.  - trend daily BMP - d/c continuous NS infusion - consider d/c keppra if worsening and transition to different AED if needed  Leukocytosis  Possibly reactive and secondary to recent surgery.  Lungs CTA with normal CXR yesterday, abdomen soft and non tender. However having fevers and thick green secretions concerning for PNA.   - trend CBC - day 1/5 levofloxacin for CAP coverage - respiratory culture  - consider repeat CXR   Hypocalcemia - replete - trend electrolytes  Polysubstance use Patient had UDS positive for amphetamines and is a current tobacco smoker. No evidence of withdrawal thus far.  - monitor for withdrawal symptoms   Best Practice (right click and "Reselect all SmartList Selections" daily)   Diet/type: tubefeeds DVT prophylaxis: SCD GI prophylaxis: PPI Lines: N/A Foley:  Yes, and it is still needed Code Status:  full code Last date of multidisciplinary goals of care discussion [per primary team]  Labs   CBC: Recent Labs  Lab 12/13/2020 1420 12/07/2020 2000 11/21/20 0433 11/21/20 0657  WBC 21.0*  --   --  22.5*  NEUTROABS 16.1*  --   --   --   HGB 16.8 16.0 15.0 14.6  HCT 47.6 47.0 44.0 44.0  MCV 95.6  --   --  99.1  PLT 188  --   --  145*    Basic Metabolic Panel: Recent Labs  Lab 11/22/2020 1420 11/29/2020 2000 11/21/20 0433  NA 143 145 149*  K 3.8 3.9 4.3  CL 106  --   --   CO2 27  --   --   GLUCOSE 193*  --   --   BUN 21*  --   --   CREATININE 0.97  --   --   CALCIUM 9.3  --   --    GFR: Estimated Creatinine Clearance: 77.9 mL/min (by C-G formula based on SCr of 0.97 mg/dL). Recent Labs  Lab 11/15/2020 1420 11/21/20 0657  WBC 21.0* 22.5*    Liver Function Tests: Recent Labs  Lab 12/01/2020 1420  AST 97*  ALT 102*  ALKPHOS 126  BILITOT 1.5*  PROT 7.8  ALBUMIN 3.7   No results for input(s): LIPASE, AMYLASE in the last 168 hours. No results for input(s): AMMONIA in the last 168 hours.  ABG    Component Value Date/Time   PHART 7.303 (L) 11/21/2020 0433   PCO2ART 46.3 11/21/2020 0433   PO2ART 89 11/21/2020 0433   HCO3 22.8 11/21/2020 0433   TCO2 24 11/21/2020 0433   ACIDBASEDEF 4.0 (H) 11/21/2020 0433   O2SAT 95.0  11/21/2020 0433     Coagulation Profile: Recent Labs  Lab 11/29/2020 1420  INR 1.1    Cardiac Enzymes: Recent Labs  Lab 11/22/2020 1420  CKTOTAL 256    HbA1C: No results found for: HGBA1C  CBG: Recent Labs  Lab 12/07/2020 0815 12/05/2020 1418  GLUCAP 96 160*    Review of Systems:   Patient intubated and stuporous. Unable to complete ROS.   Past Medical History:  He,  has a past medical history of Anxiety, Depression, Hypertension, and MI (myocardial infarction) [facility]) (Feb 2014).   Surgical History:   Past Surgical History:  Procedure Laterality Date   carpal tunnel release surgery     IR TRANSCATH/EMBOLIZ  12-17-20     Social History:   reports that he has been smoking cigarettes. He has  a 20.00 pack-year smoking history. He has never used smokeless tobacco. He reports current alcohol use. He reports that he does not use drugs.   Family History:  His family history is not on file.   Allergies Allergies  Allergen Reactions   Penicillins Anaphylaxis   Tramadol Other (See Comments)    Urinary Sx   Wellbutrin [Bupropion] Other (See Comments)    Insomnia, hallucinations, sob     Home Medications  Prior to Admission medications   Medication Sig Start Date End Date Taking? Authorizing Provider  ibuprofen (ADVIL,MOTRIN) 200 MG tablet Take 800 mg by mouth every 6 (six) hours as needed for headache.   Yes [provider]     Critical care time:

## 2020-11-21 NOTE — Progress Notes (Signed)
Pt placed on PSV 8/5 by MD and pt is tolerating well at this time. RN aware. RT to continue to monitor.

## 2020-11-21 NOTE — Progress Notes (Signed)
  NEUROSURGERY PROGRESS NOTE   Pt seen and examined. No issues overnight.   EXAM: Temp:  [98.2 F (36.8 C)-101.66 F (38.7 C)] 98.2 F (36.8 C) (08/08 1600) Pulse Rate:  [72-94] 84 (08/08 1536) Resp:  [13-31] 23 (08/08 1536) BP: (101-150)/(52-75) 126/56 (08/08 1536) SpO2:  [91 %-100 %] 95 % (08/08 1500) Arterial Line BP: (91-138)/(45-92) 91/68 (08/08 1500) FiO2 (%):  [40 %] 40 % (08/08 1537) Intake/Output      08/07 0701 08/08 0700 08/08 0701 08/09 0700   I.V. 3313.2 243   NG/GT 200    IV Piggyback 200 1308   Total Intake 3713.2 1551   Urine 1285 360   Drains 277 88   Other 22    Blood 100    Total Output 1684 448   Net +2029.2 +1103         Off propofol, minimal fentanyl No eye opening Pupils 36mm, reactive On SBT, doing well W/D all extremities to pain, not FC EVD in place, bloody CSF  LABS: Lab Results  Component Value Date   CREATININE 1.31 (H) 11/21/2020   BUN 27 (H) 11/21/2020   NA 144 11/21/2020   K 4.1 11/21/2020   CL 114 (H) 11/21/2020   CO2 20 (L) 11/21/2020   Lab Results  Component Value Date   WBC 22.5 (H) 11/21/2020   HGB 14.6 11/21/2020   HCT 44.0 11/21/2020   MCV 99.1 11/21/2020   PLT 145 (L) 11/21/2020    IMPRESSION: - 45 y.o. male SAH d# 3 s/p Acom coiling. Remains obtunded but stable neurologically. - Likely Pneumonia - AKI  PLAN: - Cont Nimotop, TCD MWF - SBT as tolerated per PCCM. Consider extubating when MS improves - Cont IVF - Cont Levaquin   Lisbeth Renshaw, MD [facility]

## 2020-11-21 NOTE — Progress Notes (Signed)
eLink Physician-Brief Progress Note Patient Name: Barry Cherry. DOB: [birth year] MRN: [id number]   Date of Service  11/21/2020  HPI/Events of Note  Hypotension - BP = 86/41 with MAP = 53.  eICU Interventions  Plan: Phenylephrine IV infusion via PIV. Titrate to SBP = 110-120.     Intervention Category Major Interventions: Hypotension - evaluation and management  Lenell Antu 11/21/2020, 9:23 PM

## 2020-11-21 NOTE — Progress Notes (Signed)
Initial Nutrition Assessment  DOCUMENTATION CODES:   Not applicable  INTERVENTION:   Initiate tube feeding via Cortrak: Osmolite 1.5 at 25 ml/h and increase by 10 ml every 8 hours to goal rate of 55 ml/h (1320 ml per day) Prosource TF 45 ml daily  Provides 2020 kcal, 100 gm protein, 1003 ml free water daily    NUTRITION DIAGNOSIS:   Inadequate oral intake related to inability to eat as evidenced by NPO status.  GOAL:   Patient will meet greater than or equal to 90% of their needs  MONITOR:   TF tolerance  REASON FOR ASSESSMENT:   Ventilator    ASSESSMENT:   Pt with PMH of polysubstance abuse admitted after being found unresponsive with large SAH with intraventricular extension. UDS positive for amphetamines.   Pt weaning but mental status prevents extubation at this time. Per ECHO pt with new grade 1 HF. Pt with fevers on antibiotics.   8/6 s/p EVD placement  8/7 s/p aneurysmal coiling  8/8 s/p cortrak placement, tip gastric   Patient is currently intubated on ventilator support MV: 9.4 L/min Temp (24hrs), Avg:100.2 F (37.9 C), Min:98.6 F (37 C), Max:101.66 F (38.7 C)   Medications reviewed and include: nimotop, protonix Fentanyl  Levaquin  Keppra Cardene   Labs reviewed: PO4: 4.8 EVD: 277 ml    Diet Order:   Diet Order             Diet NPO time specified  Diet effective now                   EDUCATION NEEDS:   No education needs have been identified at this time  Skin:  Skin Assessment: Reviewed RN Assessment  Last BM:  unknown  Height:   Ht Readings from Last 1 Encounters:  11/29/2020 5' 2.99" (1.6 m)    Weight:   Wt Readings from Last 1 Encounters:  11/26/2020 56.7 kg    BMI:  There is no height or weight on file to calculate BMI.  Estimated Nutritional Needs:   Kcal:  1900-2100  Protein:  95-110 grams  Fluid:  2 L/day  Cammy Copa., RD, LDN, CNSC See AMiON for contact information

## 2020-11-21 NOTE — Procedures (Signed)
Cortrak  Person Inserting Tube:  Kairo Laubacher E, RD Tube Type:  Cortrak - 43 inches Tube Size:  10 Tube Location:  Left nare Initial Placement:  Stomach Secured by: Bridle Technique Used to Measure Tube Placement:  Marking at nare/corner of mouth Cortrak Secured At:  65 cm   Cortrak Tube Team Note:  Consult received to place a Cortrak feeding tube.   X-ray is required, abdominal x-ray has been ordered by the Cortrak team. Please confirm tube placement before using the Cortrak tube.   If the tube becomes dislodged please keep the tube and contact the Cortrak team at [email or web address] (password [email or web address]) for replacement.  If after hours and replacement cannot be delayed, place a NG tube and confirm placement with an abdominal x-ray.    Brecklyn Galvis, MS, RD, LDN (she/her/hers) RD pager number and weekend/on-call pager number located in Amion.    

## 2020-11-21 NOTE — Progress Notes (Signed)
Transcranial Doppler  Date POD PCO2 HCT BP  MCA ACA PCA OPHT SIPH VERT Basilar  8/8 CK     Right  Left   58  62   -17  -24   35  21   25  *   *  *   *  *   *           Right  Left                                            Right  Left                                             Right  Left                                             Right  Left                                            Right  Left                                            Right  Left                                       *unable to insonate  MCA = Middle Cerebral Artery      OPHT = Opthalmic Artery     BASILAR = Basilar Artery   ACA = Anterior Cerebral Artery     SIPH = Carotid Siphon PCA = Posterior Cerebral Artery   VERT = Verterbral Artery                   Normal MCA = 62+\-12 ACA = 50+\-12 PCA = 42+\-23   Right Lindegard Ratio: 1.9 Left Lindegard Ratio: 2.0

## 2020-11-21 NOTE — Progress Notes (Signed)
eLink Physician-Brief Progress Note Patient Name: Barry Cherry. DOB: [birth year] MRN: [id number]   Date of Service  11/21/2020  HPI/Events of Note  Hyperglycemia - Blood glucose = 207.   eICU Interventions  Plan: Q 4 hour sensitive Novolog SSI.     Intervention Category Major Interventions: Hyperglycemia - active titration of insulin therapy  Lenell Antu 11/21/2020, 8:50 PM

## 2020-11-22 ENCOUNTER — Inpatient Hospital Stay (HOSPITAL_COMMUNITY): Payer: Medicaid Other

## 2020-11-22 ENCOUNTER — Other Ambulatory Visit (HOSPITAL_COMMUNITY): Payer: Self-pay

## 2020-11-22 DIAGNOSIS — N179 Acute kidney failure, unspecified: Secondary | ICD-10-CM

## 2020-11-22 DIAGNOSIS — I609 Nontraumatic subarachnoid hemorrhage, unspecified: Secondary | ICD-10-CM

## 2020-11-22 DIAGNOSIS — J9601 Acute respiratory failure with hypoxia: Secondary | ICD-10-CM

## 2020-11-22 DIAGNOSIS — R579 Shock, unspecified: Secondary | ICD-10-CM

## 2020-11-22 LAB — PHOSPHORUS
Phosphorus: 3.2 mg/dL (ref 2.5–4.6)
Phosphorus: 2.8 mg/dL (ref 2.5–4.6)

## 2020-11-22 LAB — GLUCOSE, CAPILLARY
Glucose-Capillary: 205 mg/dL — ABNORMAL HIGH (ref 70–99)
Glucose-Capillary: 226 mg/dL — ABNORMAL HIGH (ref 70–99)
Glucose-Capillary: 239 mg/dL — ABNORMAL HIGH (ref 70–99)
Glucose-Capillary: 210 mg/dL — ABNORMAL HIGH (ref 70–99)
Glucose-Capillary: 120 mg/dL — ABNORMAL HIGH (ref 70–99)

## 2020-11-22 LAB — POCT I-STAT 7, (LYTES, BLD GAS, ICA,H+H)
Acid-base deficit: 12 mmol/L — ABNORMAL HIGH (ref 0.0–2.0)
Acid-base deficit: 5 mmol/L — ABNORMAL HIGH (ref 0.0–2.0)
Bicarbonate: 14.3 mmol/L — ABNORMAL LOW (ref 20.0–28.0)
Bicarbonate: 19.3 mmol/L — ABNORMAL LOW (ref 20.0–28.0)
Calcium, Ion: 1.03 mmol/L — ABNORMAL LOW (ref 1.15–1.40)
Calcium, Ion: 1.08 mmol/L — ABNORMAL LOW (ref 1.15–1.40)
HCT: 35 % — ABNORMAL LOW (ref 39.0–52.0)
HCT: 37 % — ABNORMAL LOW (ref 39.0–52.0)
Hemoglobin: 11.9 g/dL — ABNORMAL LOW (ref 13.0–17.0)
Hemoglobin: 12.6 g/dL — ABNORMAL LOW (ref 13.0–17.0)
O2 Saturation: 77 %
O2 Saturation: 88 %
Patient temperature: 100.2
Patient temperature: 102.6
Potassium: 4.6 mmol/L (ref 3.5–5.1)
Potassium: 5.8 mmol/L — ABNORMAL HIGH (ref 3.5–5.1)
Sodium: 150 mmol/L — ABNORMAL HIGH (ref 135–145)
Sodium: 151 mmol/L — ABNORMAL HIGH (ref 135–145)
TCO2: 15 mmol/L — ABNORMAL LOW (ref 22–32)
TCO2: 20 mmol/L — ABNORMAL LOW (ref 22–32)
pCO2 arterial: 35.3 mmHg (ref 32.0–48.0)
pCO2 arterial: 37.6 mmHg (ref 32.0–48.0)
pH, Arterial: 7.201 — ABNORMAL LOW (ref 7.350–7.450)
pH, Arterial: 7.349 — ABNORMAL LOW (ref 7.350–7.450)
pO2, Arterial: 57 mmHg — ABNORMAL LOW (ref 83.0–108.0)
pO2, Arterial: 59 mmHg — ABNORMAL LOW (ref 83.0–108.0)

## 2020-11-22 LAB — CBC
HCT: 38.3 % — ABNORMAL LOW (ref 39.0–52.0)
Hemoglobin: 12.6 g/dL — ABNORMAL LOW (ref 13.0–17.0)
MCH: 32.4 pg (ref 26.0–34.0)
MCHC: 32.9 g/dL (ref 30.0–36.0)
MCV: 98.5 fL (ref 80.0–100.0)
Platelets: 183 10*3/uL (ref 150–400)
RBC: 3.89 MIL/uL — ABNORMAL LOW (ref 4.22–5.81)
RDW: 15.3 % (ref 11.5–15.5)
WBC: 18.4 10*3/uL — ABNORMAL HIGH (ref 4.0–10.5)
nRBC: 0 % (ref 0.0–0.2)

## 2020-11-22 LAB — BASIC METABOLIC PANEL
Anion gap: 6 (ref 5–15)
BUN: 51 mg/dL — ABNORMAL HIGH (ref 6–20)
CO2: 18 mmol/L — ABNORMAL LOW (ref 22–32)
Calcium: 7.5 mg/dL — ABNORMAL LOW (ref 8.9–10.3)
Chloride: 121 mmol/L — ABNORMAL HIGH (ref 98–111)
Creatinine, Ser: 2.11 mg/dL — ABNORMAL HIGH (ref 0.61–1.24)
GFR, Estimated: 39 mL/min — ABNORMAL LOW (ref >60)
Glucose, Bld: 238 mg/dL — ABNORMAL HIGH (ref 70–99)
Potassium: 4.3 mmol/L (ref 3.5–5.1)
Sodium: 145 mmol/L (ref 135–145)

## 2020-11-22 LAB — CK: Total CK: 821 U/L — ABNORMAL HIGH (ref 49–397)

## 2020-11-22 LAB — MAGNESIUM
Magnesium: 2.3 mg/dL (ref 1.7–2.4)
Magnesium: 2.5 mg/dL — ABNORMAL HIGH (ref 1.7–2.4)

## 2020-11-22 MED ORDER — PRISMASOL BGK 4/2.5 32-4-2.5 MEQ/L EC SOLN
Status: DC
  Filled 2020-11-22 (×13): qty 5000

## 2020-11-22 MED ORDER — FUROSEMIDE 10 MG/ML IJ SOLN
INTRAMUSCULAR | Status: AC
  Administered 2020-11-22: 40 mg via INTRAVENOUS
  Filled 2020-11-22: qty 4

## 2020-11-22 MED ORDER — PANTOPRAZOLE SODIUM 40 MG IV SOLR: 40.0000 mg | Freq: Two times a day (BID) | INTRAVENOUS | Status: DC

## 2020-11-22 MED ORDER — HEPARIN SODIUM (PORCINE) 1000 UNIT/ML DIALYSIS
1000.0000 [IU] | INTRAMUSCULAR | Status: DC | PRN
  Filled 2020-11-22 (×2): qty 3
  Filled 2020-11-22: qty 6

## 2020-11-22 MED ORDER — SODIUM CHLORIDE 0.9% FLUSH: 10.0000 mL | INTRAVENOUS | Status: DC | PRN

## 2020-11-22 MED ORDER — FUROSEMIDE 10 MG/ML IJ SOLN
100.0000 mg | Freq: Once | INTRAVENOUS | Status: AC
  Administered 2020-11-22: 100 mg via INTRAVENOUS
  Filled 2020-11-22: qty 10

## 2020-11-22 MED ORDER — SODIUM BICARBONATE 8.4 % IV SOLN
INTRAVENOUS | Status: AC
  Administered 2020-11-22: 100 meq
  Filled 2020-11-22: qty 100

## 2020-11-22 MED ORDER — PRISMASOL BGK 4/2.5 32-4-2.5 MEQ/L REPLACEMENT SOLN
Status: DC
  Filled 2020-11-22 (×3): qty 5000

## 2020-11-22 MED ORDER — SODIUM CHLORIDE 0.9% FLUSH
10.0000 mL | Freq: Two times a day (BID) | INTRAVENOUS | Status: DC
  Administered 2020-11-22: 30 mL
  Administered 2020-11-22: 10 mL
  Administered 2020-11-22: 30 mL
  Administered 2020-11-23: 10 mL
  Administered 2020-11-23: 20 mL

## 2020-11-22 MED ORDER — PHENYLEPHRINE CONCENTRATED 100MG/250ML (0.4 MG/ML) INFUSION SIMPLE
0.0000 ug/min | INTRAVENOUS | Status: DC
  Administered 2020-11-22 – 2020-11-23 (×2): 400 ug/min via INTRAVENOUS
  Administered 2020-11-23: 300 ug/min via INTRAVENOUS
  Filled 2020-11-22 (×5): qty 250

## 2020-11-22 MED ORDER — PRISMASOL BGK 4/2.5 32-4-2.5 MEQ/L REPLACEMENT SOLN
Status: DC
  Filled 2020-11-22 (×5): qty 5000

## 2020-11-22 MED ORDER — PHENYLEPHRINE HCL-NACL 20-0.9 MG/250ML-% IV SOLN
0.0000 ug/min | INTRAVENOUS | Status: DC
  Administered 2020-11-22 (×2): 250 ug/min via INTRAVENOUS
  Administered 2020-11-22: 150 ug/min via INTRAVENOUS
  Administered 2020-11-22: 350 ug/min via INTRAVENOUS
  Administered 2020-11-22: 400 ug/min via INTRAVENOUS
  Administered 2020-11-22: 250 ug/min via INTRAVENOUS
  Administered 2020-11-22: 400 ug/min via INTRAVENOUS
  Administered 2020-11-22: 350 ug/min via INTRAVENOUS
  Administered 2020-11-22: 300 ug/min via INTRAVENOUS
  Administered 2020-11-22: 400 ug/min via INTRAVENOUS
  Filled 2020-11-22: qty 500
  Filled 2020-11-22: qty 250
  Filled 2020-11-22: qty 500
  Filled 2020-11-22 (×8): qty 250
  Filled 2020-11-22: qty 500

## 2020-11-22 MED ORDER — SODIUM CHLORIDE 0.9 % IV SOLN
2.0000 g | Freq: Two times a day (BID) | INTRAVENOUS | Status: DC
  Administered 2020-11-22 – 2020-11-23 (×3): 2 g via INTRAVENOUS
  Filled 2020-11-22 (×3): qty 2

## 2020-11-22 MED ORDER — MIDAZOLAM HCL 2 MG/2ML IJ SOLN: 2.0000 mg | Freq: Once | INTRAMUSCULAR | Status: AC

## 2020-11-22 MED ORDER — MIDAZOLAM HCL 2 MG/2ML IJ SOLN
2.0000 mg | Freq: Once | INTRAMUSCULAR | Status: AC
  Administered 2020-11-22: 2 mg via INTRAVENOUS

## 2020-11-22 MED ORDER — SODIUM CHLORIDE 0.9 % FOR CRRT
INTRAVENOUS_CENTRAL | Status: DC | PRN
Start: 2020-11-22 — End: 2020-11-23

## 2020-11-22 MED ORDER — ROCURONIUM BROMIDE 10 MG/ML (PF) SYRINGE
PREFILLED_SYRINGE | INTRAVENOUS | Status: AC
  Filled 2020-11-22: qty 10

## 2020-11-22 MED ORDER — PANTOPRAZOLE SODIUM 40 MG PO PACK
40.0000 mg | PACK | Freq: Every day | ORAL | Status: DC
  Administered 2020-11-22: 40 mg
  Filled 2020-11-22: qty 20

## 2020-11-22 MED ORDER — PANTOPRAZOLE INFUSION (NEW) - SIMPLE MED
8.0000 mg/h | INTRAVENOUS | Status: DC
  Filled 2020-11-22: qty 100

## 2020-11-22 MED ORDER — SODIUM BICARBONATE 8.4 % IV SOLN: 100.0000 meq | Freq: Once | INTRAVENOUS | Status: AC

## 2020-11-22 MED ORDER — PANTOPRAZOLE 80MG IVPB - SIMPLE MED
80.0000 mg | Freq: Once | INTRAVENOUS | Status: DC
  Filled 2020-11-22: qty 100

## 2020-11-22 MED ORDER — ROCURONIUM BROMIDE 50 MG/5ML IV SOLN
20.0000 mg | Freq: Once | INTRAVENOUS | Status: AC
  Administered 2020-11-22: 20 mg via INTRAVENOUS
  Filled 2020-11-22: qty 2

## 2020-11-22 MED ORDER — MIDAZOLAM HCL 2 MG/2ML IJ SOLN
INTRAMUSCULAR | Status: AC
  Filled 2020-11-22: qty 2

## 2020-11-22 MED ORDER — FUROSEMIDE 10 MG/ML IJ SOLN
40.0000 mg | Freq: Once | INTRAMUSCULAR | Status: AC
  Administered 2020-11-22: 40 mg via INTRAVENOUS
  Filled 2020-11-22: qty 4

## 2020-11-22 MED ORDER — MIDAZOLAM HCL 2 MG/2ML IJ SOLN
INTRAMUSCULAR | Status: AC
  Administered 2020-11-22: 2 mg via INTRAVENOUS
  Filled 2020-11-22: qty 2

## 2020-11-22 MED ORDER — NOREPINEPHRINE 4 MG/250ML-% IV SOLN
0.0000 ug/min | INTRAVENOUS | Status: DC
  Administered 2020-11-22: 40 ug/min via INTRAVENOUS
  Administered 2020-11-22: 2 ug/min via INTRAVENOUS
  Administered 2020-11-22: 40 ug/min via INTRAVENOUS
  Filled 2020-11-22 (×4): qty 250

## 2020-11-22 MED ORDER — SODIUM CHLORIDE 0.9 % IV BOLUS
1000.0000 mL | Freq: Once | INTRAVENOUS | Status: AC
  Administered 2020-11-22: 1000 mL via INTRAVENOUS

## 2020-11-22 MED ORDER — ALBUMIN HUMAN 5 % IV SOLN
12.5000 g | Freq: Once | INTRAVENOUS | Status: AC
  Administered 2020-11-22: 12.5 g via INTRAVENOUS

## 2020-11-22 MED ORDER — NOREPINEPHRINE 16 MG/250ML-% IV SOLN
0.0000 ug/min | INTRAVENOUS | Status: DC
  Administered 2020-11-22 – 2020-11-23 (×4): 40 ug/min via INTRAVENOUS
  Filled 2020-11-22 (×6): qty 250

## 2020-11-22 MED ORDER — FUROSEMIDE 10 MG/ML IJ SOLN: 40.0000 mg | Freq: Once | INTRAMUSCULAR | Status: AC

## 2020-11-22 MED ORDER — VASOPRESSIN 20 UNITS/100 ML INFUSION FOR SHOCK
0.0000 [IU]/min | INTRAVENOUS | Status: DC
  Administered 2020-11-22 (×2): 0.03 [IU]/min via INTRAVENOUS
  Administered 2020-11-23 (×2): 0.04 [IU]/min via INTRAVENOUS
  Filled 2020-11-22 (×5): qty 100

## 2020-11-22 NOTE — Procedures (Signed)
Intubation Procedure Note  Barry Cherry  [id number]  05-29-1975  Date:11/22/20  Time:10:01 PM   Provider Performing:Catalena Stanhope W Mikey Bussing    Procedure: Intubation (31500)  Indication(s) Respiratory Failure  Consent Unable to obtain consent due to emergent nature of procedure.   Anesthesia Versed, Fentanyl, and Rocuronium   Time Out Verified patient identification, verified procedure, site/side was marked, verified correct patient position, special equipment/implants available, medications/allergies/relevant history reviewed, required imaging and test results available.   Sterile Technique Usual hand hygeine, masks, and gloves were used   Procedure Description Patient positioned in bed supine.  Sedation given as noted above.  Patient was intubated with endotracheal tube over bougie after existing tube removed over bougie. One attempt.   Complications/Tolerance None; patient tolerated the procedure well. Chest X-ray is ordered to verify placement.   EBL Minimal   Specimen(s) None   Joneen Roach, AGACNP-BC [place] [facility]  See Amion for personal pager PCCM on call pager [phone] until 7pm. Please call Elink 7p-7a. [phone]  11/22/2020 10:02 PM

## 2020-11-22 NOTE — Progress Notes (Signed)
Pharmacy Antibiotic Note  Barry Cherry. is a 45 y.o. male admitted on November 26, 2020 with pneumonia.  Pharmacy has been consulted to broaden antibiotic coverage from Levaquin to Cefepime dosing.  Worsening leukocytosis. Trach aspirate growing abundant GNRs   Plan: -Stop Levaquin per MD  -Start Cefepime 2 gM IV Q 12 hours  -F/u CBC, renal fx, cultures and clinical progress   Weight: 65.6 kg (144 lb 10 oz)  Temp (24hrs), Avg:99.3 F (37.4 C), Min:98.2 F (36.8 C), Max:100.4 F (38 C)  Recent Labs  Lab 11-26-2020 1420 11/21/20 0657  WBC 21.0* 22.5*  CREATININE 0.97 1.31*    Estimated Creatinine Clearance: 57.9 mL/min (A) (by C-G formula based on SCr of 1.31 mg/dL (H)).    Allergies  Allergen Reactions   Penicillins Anaphylaxis   Tramadol Other (See Comments)    Urinary Sx   Wellbutrin [Bupropion] Other (See Comments)    Insomnia, hallucinations, sob    Antimicrobials this admission: Levoflox 8/8>>8/9 Cefepime 8/9>>  Dose adjustments this admission:  Microbiology results: 8/8 TA >> abundant GNRs   Thank you for allowing pharmacy to be a part of this patient's care.  Vinnie Level, PharmD., BCPS, BCCCP Clinical Pharmacist Please refer to [facility] for unit-specific pharmacist

## 2020-11-22 NOTE — Procedures (Signed)
Central Venous Catheter Insertion Procedure Note  Barry Cherry  [id number]  05-06-1975  Date:11/22/20  Time:10:00 PM   Provider Performing:Katharine Rochefort Lacretia Nicks Mikey Bussing   Procedure: Insertion of Non-tunneled Central Venous Catheter(36556) with US guidance (59935)   Indication(s) Hemodialysis  Consent Unable to obtain consent due to emergent nature of procedure.  Anesthesia Topical only with 1% lidocaine   Timeout Verified patient identification, verified procedure, site/side was marked, verified correct patient position, special equipment/implants available, medications/allergies/relevant history reviewed, required imaging and test results available.  Sterile Technique Maximal sterile technique including full sterile barrier drape, hand hygiene, sterile gown, sterile gloves, mask, hair covering, sterile ultrasound probe cover (if used).  Procedure Description Area of catheter insertion was cleaned with chlorhexidine and draped in sterile fashion.  With real-time ultrasound guidance a HD catheter was placed into the right internal jugular vein. Nonpulsatile blood flow and easy flushing noted in all ports.  The catheter was sutured in place and sterile dressing applied.  Complications/Tolerance None; patient tolerated the procedure well. Chest X-ray is ordered to verify placement for internal jugular or subclavian cannulation.   Chest x-ray is not ordered for femoral cannulation.  EBL Minimal  Specimen(s) None  Ultrasound powered down after photo was taken and was therefore unavailable to be placed in note.    Joneen Roach, AGACNP-BC [place] [facility]  See Amion for personal pager PCCM on call pager [phone] until 7pm. Please call Elink 7p-7a. [phone]  11/22/2020 10:01 PM

## 2020-11-22 NOTE — Progress Notes (Signed)
Patient at 100% FiO2 staying between 88-90% SpO2. Dr Denese Killings notified.

## 2020-11-22 NOTE — Progress Notes (Signed)
eLink Physician-Brief Progress Note Patient Name: Greysin Medlen. DOB: Jan 14, 1976 MRN: [id number]   Date of Service  11/22/2020  HPI/Events of Note  CXR reveals worsening bilateral airspace disease suspicious for pneumonia. Patient is already on Abx Rx with Cefepime for pneumonia.  eICU Interventions  Plan: Increase PEEP to 12.     Intervention Category Major Interventions: Hypoxemia - evaluation and management  Jamarques Pinedo Dennard Nip 11/22/2020, 11:38 PM

## 2020-11-22 NOTE — Progress Notes (Addendum)
PCCM INTERVAL PROGRESS NOTE   Called to bedside to evaluate for HD catheter placement as nephrology has recommended CRRT.   Upon my arrival to bedside patient had significant cuff leak, which could not be remedied by repositioning of the tube, vent changes, or sedation.  Decision was made to exchange ETT. See separate procedure note.    Additional critical care time 25 minutes.   Joneen Roach, AGACNP-BC [place] [facility]  See Amion for personal pager PCCM on call pager [phone] until 7pm. Please call Elink 7p-7a. [phone]  11/22/2020 10:04 PM

## 2020-11-22 NOTE — Progress Notes (Signed)
Patient ID: Barry Los., male   DOB: 1975-09-09, 45 y.o.   MRN: [id number]   NAME:  Barry Gillian., MRN:  [id number], DOB:  1976-02-15, LOS: 3 ADMISSION DATE:  12/11/20, CONSULTATION DATE:  12/11/2020 REFERRING MD:  Maurice Small, CHIEF COMPLAINT:  AMS   History of Present Illness:  Barry Cherry is a 45 year old man who presented to [facility] on 8/5/2 afternoon with altered mental status and reported overdose.  He reportedly (per EMS) snorted an unknown drug.   Initially was given narcan with some improvement.   UDS postive for amphetamines. Was actually discharged on 8/6 from ed, returned in the early afternoon, two hours (per note times) after discharge with worsening mental status ,found unresponsive by his girlfriend who has taken him home. 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  Intubated for airway protection at [facility] prior to transfer to [facility] neurosurgery. Nicardipine started for SBP control Per neurosurgery note: "CTA shows Acomm aneurysm, possible Pcomm vs infundibulum. Hunt-Hess 3 at presentation before hydrocephalus."   EVD placed by neurosurgeon at the bedside  Pertinent  Medical History  Polysubstance use with UDS + for amphetamines   Significant Hospital Events: Including procedures, antibiotic start and stop dates in addition to other pertinent events   8/6: admitted, intubated, EVD placed 8/7: aneurysmal coiling completed 8/8: d/c sedation 8/9: hypotensive overnight requiring max dose phenylephrine and norepinephrine. New lack of response in upper extremities.   Interim History / Subjective:  Overnight had hypotension and required phenylephrine up to max dose and norepinephrine. Also had placement of central line and initiation of SSI for hyperglycemia.  Objective   Blood pressure (!) 113/54, pulse 80, temperature 99.9 F (37.7 C), temperature source Axillary, resp. rate 20, weight 65.6 kg, SpO2 93 %. CVP:  [8 mmHg-9 mmHg] 9 mmHg  Vent  Mode: PRVC FiO2 (%):  [40 %] 40 % Set Rate:  [18 bmp] 18 bmp Vt Set:  [450 mL] 450 mL PEEP:  [5 cmH20-8 cmH20] 8 cmH20 Pressure Support:  [8 cmH20] 8 cmH20 Plateau Pressure:  [17 cmH20-18 cmH20] 17 cmH20   Intake/Output Summary (Last 24 hours) at 11/22/2020 0743 Last data filed at 11/22/2020 0700 Gross per 24 hour  Intake 5495.85 ml  Output 974 ml  Net 4521.85 ml   Filed Weights   11/22/20 0500  Weight: 65.6 kg    Examination: General: Thin appearing middle aged male. Lying in bed mechanically intubated.  HENT: Incision just to the right of midline of skull from surgery. No surrounding edema, redness, or drainage.  Lungs: CTA bilaterally, mechanically intubated Cardiovascular: RRR, no m/r/g Abdomen: soft, non tender, non distended, bowel sounds present  Extremities: warm, dry, well perfused, generalized edema present  Neuro: withdraws lower extremities to noxious stimuli, and moves lower extremities spontaneously against gravity, pupils equal and sluggishly reactive to light bilaterally, pupils small, gag reflex present. 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 However, patient was not alert enough to work towards extubation yesterday and fentanyl was restarted.  Overnight, patient had hypotension requiring max phenylephrine and some norepinephrine as well. Patient also not moving upper extremities on exam this morning, and per nursing was not moving upper extremitties overnight either. Concern for vasospasm secondary to [facility]. Discussed  with neurosurgery and will hold off on imaging for now.   - hemodynamic augmentation   - continue phenylephrine and norepinephrine and titrate as needed  - start vasopressin and wean norepi and phenylephrine  as able - monitor for signs of vasospasm - continue nimodipine - continue keppra for seizure ppx - neurosurgery following  - trans cranial doppler MWF  Hypotension Overnight, patient had hypotension requiring max phenylephrine and some norepinephrine as well. Patient also not moving upper extremities on exam this morning, and per nursing was not moving upper extremitties overnight either. Concern for vasospasm secondary to [facility].  Bedside echo showed normal cardiac function and IVC not varied with respiration. Low concern for volume or cardiogenic induced hypotension. Neurologic source of hypotension also not likely considering.  Concern for possible sepsis secondary to aspiration PNA, however oxygen requirements do not correlate to this and CXR yesterday without evidence of consolidation. Pulmonary exam also clear bilaterally.  Possible that hypotension secondary to amphetamine withdrawal given history and lack of other convincing etiology.   - start vasopressin and wean phenylephrine and levophed as able - thought process is that vasopressin works on different receptor and therefore bp will have better response.  - started on levofloxacin yesterday with concern for aspiration PNA, will broaden coverage and transition to cefepime - a line was placed last night, but readings discordant with cuff pressure, started femoral a line.   Leukocytosis  Possibly reactive and secondary to recent surgery. Lungs CTA with unchanged CXR yesterday, abdomen soft and non tender. However having fevers and thick green secretions concerning for PNA.  Gram stain of respiratory culture showed abundant G negative rods, moderate G positive cocci, and few gram variable rods. UA negative. No evidence of endocarditis on echo.   - trend CBC - transition to cefepime as above - follow respiratory culture  - consider repeat CXR if not improving  Acute respiratory failure 2/2 SAH Acute encephalopathy 2/2 SAH - attempt to  wean lung protective ventilation as able - continue lung protective ventilation - VAP bundle - ABG tomorrow   New grade 1 diastolic HF Echo showed LV EF = > 75% with mild eccentric LV hypertrophy of posterior-lateral segment. Small pericardial effusion also present posterior to LV.  No intracardiac source of embolism identified.  - outpatient cardiology follow up   Hyperchloremic acidosis  Likely secondary to continuous NS infusion.  - ABG tomorrow   AKI  Creatinine increased to 1.31 from 0.97. Possibly due to keppra, though not common. Most likely due to continuous NS infusion.  - trend daily BMP  Hypocalcemia - trend electrolytes  Hyperglycemia - SSI  Polysubstance use Patient had UDS positive for amphetamines and is a current tobacco smoker. No evidence of withdrawal thus far.  - monitor for withdrawal symptoms   Best Practice (right click and "Reselect all SmartList Selections" daily)   Diet/type: tubefeeds DVT prophylaxis: SCD GI prophylaxis: PPI Lines: Central line Foley:  N/A Code Status:  full code Last date of multidisciplinary goals of care discussion [per primary team]  Labs   CBC: Recent Labs  Lab 2020/11/26 1420 11/26/20 2000 11/21/20 0433 11/21/20 0657  WBC 21.0*  --   --  22.5*  NEUTROABS 16.1*  --   --   --   HGB 16.8 16.0 15.0 14.6  HCT 47.6 47.0 44.0 44.0  MCV 95.6  --   --  99.1  PLT 188  --   --  145*    Basic Metabolic Panel: Recent Labs  Lab 2020/12/01 1420 2020-12-01 2000 11/21/20 0433 11/21/20 0657 11/22/20 0554  NA 143 145 149* 144  --   K 3.8 3.9 4.3 4.1  --   CL 106  --   --  114*  --   CO2 27  --   --  20*  --   GLUCOSE 193*  --   --  116*  --   BUN 21*  --   --  27*  --   CREATININE 0.97  --   --  1.31*  --   CALCIUM 9.3  --   --  7.5*  --   MG  --   --   --  1.9 2.3  PHOS  --   --   --  4.8* 3.2   GFR: Estimated Creatinine Clearance: 57.9 mL/min (A) (by C-G formula based on SCr of 1.31 mg/dL (H)). Recent Labs  Lab  12-01-2020 1420 11/21/20 0657  WBC 21.0* 22.5*    Liver Function Tests: Recent Labs  Lab 12/01/20 1420 11/21/20 0657  AST 97* 88*  ALT 102* 70*  ALKPHOS 126 88  BILITOT 1.5* 2.0*  PROT 7.8 5.8*  ALBUMIN 3.7 2.5*   No results for input(s): LIPASE, AMYLASE in the last 168 hours. No results for input(s): AMMONIA in the last 168 hours.  ABG    Component Value Date/Time   PHART 7.303 (L) 11/21/2020 0433   PCO2ART 46.3 11/21/2020 0433   PO2ART 89 11/21/2020 0433   HCO3 22.8 11/21/2020 0433   TCO2 24 11/21/2020 0433   ACIDBASEDEF 4.0 (H) 11/21/2020 0433   O2SAT 95.0 11/21/2020 0433     Coagulation Profile: Recent Labs  Lab 2020/12/01 1420  INR 1.1    Cardiac Enzymes: Recent Labs  Lab 12-01-20 1420  CKTOTAL 256    HbA1C: Hgb A1c MFr Bld  Date/Time Value Ref Range Status  11/21/2020 08:52 PM 5.3 4.8 - 5.6 % Final    Comment:    (NOTE) Pre diabetes:          5.7%-6.4%  Diabetes:              >6.4%  Glycemic control for   <7.0% adults with diabetes     CBG: Recent Labs  Lab 11/21/20 1559 11/21/20 1947 11/21/20 2328 11/22/20 0342 11/22/20 0739  GLUCAP 162* 207* 292* 205* 226*    Review of Systems:   Patient intubated and stuporous. Unable to complete ROS.   Past Medical History:  He,  has a past medical history of Anxiety, Depression, Hypertension, and MI (myocardial infarction) [facility]) (Feb 2014).   Surgical History:   Past Surgical History:  Procedure Laterality Date   carpal tunnel release surgery     IR TRANSCATH/EMBOLIZ  12/08/2020   RADIOLOGY WITH ANESTHESIA N/A 11/16/2020   Procedure: IR WITH ANESTHESIA;  Surgeon: Lisbeth Renshaw, MD;  Location: [facility] OR;  Service: Radiology;  Laterality: N/A;     Social History:   reports that he has been smoking cigarettes. He has a 20.00 pack-year smoking history. He has never used smokeless tobacco. He reports current alcohol use. He reports that he does not use drugs.   Family History:  His family  history is not on file.   Allergies Allergies  Allergen Reactions   Penicillins Anaphylaxis   Tramadol Other (See Comments)    Urinary Sx   Wellbutrin [Bupropion] Other (See Comments)    Insomnia, hallucinations, sob     Home Medications  Prior to Admission medications  Medication Sig Start Date End Date Taking? Authorizing Provider  ibuprofen (ADVIL,MOTRIN) 200 MG tablet Take 800 mg by mouth every 6 (six) hours as needed for headache.   Yes [provider]     Critical care time:

## 2020-11-22 NOTE — Procedures (Signed)
Central Venous Catheter Insertion Procedure Note  Procedure: Insertion of Central Venous Catheter  Indications:  vascular access, need for frequent blood draws, vasopressor administration  Procedure Details  Informed consent could not be obtained due to emergent conditions.  Maximum sterile technique was used including antiseptics, cap, gloves, gown, hand hygiene, mask, and sheet.  Under sterile conditions the skin above the on the left subclavian vein was prepped with betadine and covered with a sterile drape. Local anesthesia was applied to the skin and subcutaneous tissues. A 22-gauge needle was used to identify the vein. An 18-gauge needle was then inserted into the vein. A guide wire was then passed easily through the catheter. There were no arrhythmias. The catheter was then withdrawn. A 7.0 French triple-lumen was then inserted into the vessel over the guide wire. The catheter was sutured into place.  Findings: There were no changes to vital signs. Catheter was flushed with 10 cc NS. Patient did tolerate procedure well.  Recommendations: CXR ordered to verify placement.  Marcelle Smiling, MD Board Certified by the ABIM, Pulmonary Diseases & Critical Care Medicine

## 2020-11-22 NOTE — Progress Notes (Signed)
  NEUROSURGERY PROGRESS NOTE   Pt seen and examined. Has required increasing doses of vasopressor support overnight.   EXAM: Temp:  [98.2 F (36.8 C)-100.4 F (38 C)] 100.4 F (38 C) (08/09 0800) Pulse Rate:  [75-104] 90 (08/09 0915) Resp:  [13-31] 20 (08/09 0915) BP: (77-134)/(42-70) 120/57 (08/09 0915) SpO2:  [89 %-95 %] 93 % (08/09 0915) Arterial Line BP: (46-132)/(40-92) 107/54 (08/09 0915) FiO2 (%):  [40 %-50 %] 50 % (08/09 0945) Weight:  [65.6 kg] 65.6 kg (08/09 0500) Intake/Output      08/08 0701 08/09 0700 08/09 0701 08/10 0700   I.V. (mL/kg) 2042.4 (31.1) 679.5 (10.4)   NG/GT 1005 192.5   IV Piggyback 2448.5 0.1   Total Intake(mL/kg) 5495.9 (83.8) 872.1 (13.3)   Urine (mL/kg/hr) 750 (0.5) 75 (0.3)   Drains 224 47   Other     Blood     Total Output 974 122   Net +4521.9 +750.1         Off sedation, but just received some ativan for A-line insertion No eye opening Pupils 76mm, fixed On SBT, doing well No significant movement of BUE/BLE to noxious stim EVD in place, bloody CSF  LABS: Lab Results  Component Value Date   CREATININE 1.31 (H) 11/21/2020   BUN 27 (H) 11/21/2020   NA 144 11/21/2020   K 4.1 11/21/2020   CL 114 (H) 11/21/2020   CO2 20 (L) 11/21/2020   Lab Results  Component Value Date   WBC 22.5 (H) 11/21/2020   HGB 14.6 11/21/2020   HCT 44.0 11/21/2020   MCV 99.1 11/21/2020   PLT 145 (L) 11/21/2020    IMPRESSION: - 45 y.o. male SAH d# 4 s/p Acom coiling. Worsened from a neurologic standpoint, suspect more of a global toxic/metabolic encephalopathy rather than a focal neurologic process, likely from the below: - Sepsis with pneumonia - AKI - Hx of substance abuse including methamphetamine  PLAN: - Cont Nimotop, TCD MWF - Vent support per PCCM. - Cont to maintain euvolemia - d/w PCCM, will broaden abx coverage - Will change vasopressor to vasopressin as I wonder if his hx of substance abuse may decrease his physiologic response to  norepinephrine and phenylephrine    Lisbeth Renshaw, MD [facility]

## 2020-11-22 NOTE — Procedures (Signed)
Arterial Catheter Insertion Procedure Note  Efe Fazzino  [id number]  Jan 16, 1976  Date:11/22/20  Time:2:23 PM    Provider Performing: Lynnell Catalan    Procedure: Insertion of Arterial Line (40981) without US guidance  Indication(s) Blood pressure monitoring and/or need for frequent ABGs  Consent Unable to obtain consent due to emergent nature of procedure.  Anesthesia None   Time Out Verified patient identification, verified procedure, site/side was marked, verified correct patient position, special equipment/implants available, medications/allergies/relevant history reviewed, required imaging and test results available.   Sterile Technique Maximal sterile technique including full sterile barrier drape, hand hygiene, sterile gown, sterile gloves, mask, hair covering, sterile ultrasound probe cover (if used).   Procedure Description Area of catheter insertion was cleaned with chlorhexidine and draped in sterile fashion. Without real-time ultrasound guidance an arterial catheter was placed into the left femoral artery.  Appropriate arterial tracings confirmed on monitor.     Complications/Tolerance None; patient tolerated the procedure well.   EBL Minimal   Specimen(s) None Lynnell Catalan, MD [facility] ICU Physician [facility] [place] [facility]  Pager: [phone] Or Epic Secure Chat After hours: [phone].  11/22/2020, 2:24 PM

## 2020-11-22 NOTE — Progress Notes (Signed)
eLink Physician-Brief Progress Note Patient Name: Barry Cherry. DOB: [birth year] MRN: [id number]   Date of Service  11/22/2020  HPI/Events of Note  Patient hypotensive requiring 3 vasopressors and hypoxic with Sat = 80%. CXR pending.   eICU Interventions  Plan: ABG  STAT. Increase PEEP to 10. Await CXR. Monitor CVP now and Q 4 hours.  Will request that PCCM groud team evaluate the patient at bedside.        [name] 11/22/2020, 11:30 PM

## 2020-11-22 NOTE — Progress Notes (Signed)
During my morning assessment I noticed that patient's MAP was in the 50s by radial ART line. ART line was positional but when I manually held it it gave me a good waveform with the same MAP. Dr. Denese Killings at bedside. Patient was then maxed on levo and neo. Verbal order for bolus of albumin and 2 mg versed to sedate patient for left femoral ART line. Left femoral ART line successfully placed and left radial ART line removed without bleeding or other complications. Femoral ART line matched radial ART line in pressure. Verbal order by Dr Denese Killings to start vasopressin if needed. Will continue to monitor patient.  Sherral Hammers RN.

## 2020-11-22 NOTE — Consult Note (Signed)
Reason for Consult: Renal failure Referring Physician: Dr. Denese Killings  Chief Complaint: AMS  Assessment/Plan: Acute renal failure - in the setting of contrast + infectious process + hypotension requiring 3 vasopressors in escalating doses. In and out cath with minimal output and only 100 ml/24hrs failing challenge with higher dose Lasix. - Will need to start CRRT as he will not tolerate iHD. - Given the FIO2 requirements + CVP of 18 will challenge to see if he will tolerate 50-100 ml/hr of net UF. - Renal dose medications for CRRT.  - Avoid nephrotoxic agents such as contrast, NSAID's if at all possible. 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 Hypotension concerning for sepsis from aspiration PNA with + fever but no consolidation on CXR currently on 3 vasopressors on broad spectrum abx.Marland Kitchen  +UDS for amphetamines.    HPI: Barry Cherry. is an 45 y.o. male HTN CASHD presenting initially to an OSH with AMS for a reported unknown drug  overdose but later positive for amphetamines and seen by neurosurgery after returning to the ED. He was found to have a large SAH  with ventriculomegaly and intubated for airway protection + nicardipine gtt + EVD. 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 He had a successful coil embolization of the anterior communicating artery on 12/11/2020 requiring 150 mL of iohexol. Patient is on Levophed, Neo-synephrine, and Vasopressin with escalating  vasopressor support required + broad spectrum antibiotics (Cefepime and Levaquin) for possible pneumonia and sepsis.  Patient received Lasix  ->  x2 on the day of consultation with poor response.  Patient with only 750 ml UOP over the past 24hrs with a BUN/Cr 51/2.11 and SNa 150. Patient is net positive 9.7 L during this hospitalization.  ROS Pertinent items are noted in HPI.  Chemistry and CBC: Creatinine   Date/Time Value Ref Range Status  07/13/2012 03:36 AM 0.86 0.60 - 1.30 mg/dL Final  16/01/9603 54:09 AM 1.01 0.60 - 1.30 mg/dL Final   Creat  Date/Time Value Ref Range Status  09/14/2012 10:20 AM 0.99 0.50 - 1.35 mg/dL Final   Creatinine, Ser  Date/Time Value Ref Range Status  11/22/2020 03:14 PM 2.11 (H) 0.61 - 1.24 mg/dL Final  81/19/1478 29:56 AM 1.31 (H) 0.61 - 1.24 mg/dL Final  21/30/8657 84:69 PM 0.97 0.61 - 1.24 mg/dL Final  62/95/2841 32:44 AM 0.91 0.50 - 1.35 mg/dL Final  04/18/7251 66:44 AM 1.09 0.4 - 1.5 mg/dL Final   Recent Labs  Lab 11/16/2020 1420 12/05/2020 2000 11/21/20 0433 11/21/20 0657 11/22/20 0554 11/22/20 1514 11/22/20 1525  NA 143 145 149* 144  --  145 150*  K 3.8 3.9 4.3 4.1  --  4.3 4.6  CL 106  --   --  114*  --  121*  --   CO2 27  --   --  20*  --  18*  --   GLUCOSE 193*  --   --  116*  --  238*  --   BUN 21*  --   --  27*  --  51*  --   CREATININE 0.97  --   --  1.31*  --  2.11*  --   CALCIUM 9.3  --   --  7.5*  --  7.5*  --   PHOS  --   --   --  4.8* 3.2 2.8  --    Recent Labs  Lab 11/22/2020 1420 11/30/2020 2000 11/21/20  2426 11/21/20 0657 11/22/20 1514 11/22/20 1525  WBC 21.0*  --   --  22.5* 18.4*  --   NEUTROABS 16.1*  --   --   --   --   --   HGB 16.8   < > 15.0 14.6 12.6* 12.6*  HCT 47.6   < > 44.0 44.0 38.3* 37.0*  MCV 95.6  --   --  99.1 98.5  --   PLT 188  --   --  145* 183  --    < > = values in this interval not displayed.   Liver Function Tests: Recent Labs  Lab 11-25-2020 1420 11/21/20 0657  AST 97* 88*  ALT 102* 70*  ALKPHOS 126 88  BILITOT 1.5* 2.0*  PROT 7.8 5.8*  ALBUMIN 3.7 2.5*   No results for input(s): LIPASE, AMYLASE in the last 168 hours. No results for input(s): AMMONIA in the last 168 hours. Cardiac Enzymes: Recent Labs  Lab 2020/11/25 1420 11/22/20 1645  CKTOTAL 256 821*   Iron Studies: No results for input(s): IRON, TIBC, TRANSFERRIN, FERRITIN in the last 72  hours. PT/INR: @LABRCNTIP (inr:5)  Xrays/Other Studies: ) Results for orders placed or performed during the hospital encounter of 25-Nov-2020 (from the past 48 hour(s))  I-STAT 7, (LYTES, BLD GAS, ICA, H+H)     Status: Abnormal   Collection Time: 11/21/20  4:33 AM  Result Value Ref Range   pH, Arterial 7.303 (L) 7.350 - 7.450   pCO2 arterial 46.3 32.0 - 48.0 mmHg   pO2, Arterial 89 83.0 - 108.0 mmHg   Bicarbonate 22.8 20.0 - 28.0 mmol/L   TCO2 24 22 - 32 mmol/L   O2 Saturation 95.0 %   Acid-base deficit 4.0 (H) 0.0 - 2.0 mmol/L   Sodium 149 (H) 135 - 145 mmol/L   Potassium 4.3 3.5 - 5.1 mmol/L   Calcium, Ion 1.07 (L) 1.15 - 1.40 mmol/L   HCT 44.0 39.0 - 52.0 %   Hemoglobin 15.0 13.0 - 17.0 g/dL   Patient temperature 01/21/21 C    Collection site Radial    Drawn by RT    Sample type ARTERIAL   CBC     Status: Abnormal   Collection Time: 11/21/20  6:57 AM  Result Value Ref Range   WBC 22.5 (H) 4.0 - 10.5 K/uL   RBC 4.44 4.22 - 5.81 MIL/uL   Hemoglobin 14.6 13.0 - 17.0 g/dL   HCT 01/21/21 19.6 - 22.2 %   MCV 99.1 80.0 - 100.0 fL   MCH 32.9 26.0 - 34.0 pg   MCHC 33.2 30.0 - 36.0 g/dL   RDW 97.9 89.2 - 11.9 %   Platelets 145 (L) 150 - 400 K/uL   nRBC 0.0 0.0 - 0.2 %    Comment: Performed at [facility], [address], [place], [place]  Magnesium     Status: None   Collection Time: 11/21/20  6:57 AM  Result Value Ref Range   Magnesium 1.9 1.7 - 2.4 mg/dL    Comment: Performed at [facility], [address], [place], [place]  Phosphorus     Status: Abnormal   Collection Time: 11/21/20  6:57 AM  Result Value Ref Range   Phosphorus 4.8 (H) 2.5 - 4.6 mg/dL    Comment: Performed at [facility], [address] [place], [place]  Comprehensive metabolic panel     Status: Abnormal   Collection Time: 11/21/20  6:57 AM  Result Value Ref Range   Sodium 144 135 - 145 mmol/L   Potassium 4.1 3.5 - 5.1 mmol/L   Chloride 114 (H) 98 - 111 mmol/L    CO2 20 (L) 22 - 32 mmol/L   Glucose, Bld 116 (H) 70 - 99 mg/dL    Comment: Glucose reference range applies only to samples taken after fasting for at least 8 hours.   BUN 27 (H) 6 - 20 mg/dL   Creatinine, Ser 5.40 (H) 0.61 - 1.24 mg/dL   Calcium 7.5 (L) 8.9 - 10.3 mg/dL   Total Protein 5.8 (L) 6.5 - 8.1 g/dL   Albumin 2.5 (L) 3.5 - 5.0 g/dL   AST 88 (H) 15 - 41 U/L   ALT 70 (H) 0 - 44 U/L   Alkaline Phosphatase 88 38 - 126 U/L   Total Bilirubin 2.0 (H) 0.3 - 1.2 mg/dL   GFR, Estimated >98 >11 mL/min    Comment: (NOTE) Calculated using the CKD-EPI Creatinine Equation (2021)    Anion gap 10 5 - 15    Comment: Performed at [facility], [address], [place], [place] [address]  Culture, Respiratory w Gram Stain     Status: None (Preliminary result)   Collection Time: 11/21/20 11:01 AM   Specimen: Tracheal Aspirate; Respiratory  Result Value Ref Range   Specimen Description TRACHEAL ASPIRATE    Special Requests NONE    Gram Stain      ABUNDANT WBC PRESENT,BOTH PMN AND MONONUCLEAR ABUNDANT GRAM NEGATIVE RODS MODERATE GRAM POSITIVE COCCI FEW GRAM VARIABLE ROD    Culture      ABUNDANT STAPHYLOCOCCUS AUREUS ABUNDANT STREPTOCOCCUS PNEUMONIAE SUSCEPTIBILITIES TO FOLLOW CULTURE REINCUBATED FOR BETTER GROWTH Performed at [facility], [address] [place], [place] [address]    Report Status PENDING   Glucose, capillary     Status: Abnormal   Collection Time: 11/21/20  3:59 PM  Result Value Ref Range   Glucose-Capillary 162 (H) 70 - 99 mg/dL    Comment: Glucose reference range applies only to samples taken after fasting for at least 8 hours.  Glucose, capillary     Status: Abnormal   Collection Time: 11/21/20  7:47 PM  Result Value Ref Range   Glucose-Capillary 207 (H) 70 - 99 mg/dL    Comment: Glucose reference range applies only to samples taken after fasting for at least 8 hours.  Hemoglobin A1c     Status: None   Collection Time: 11/21/20  8:52 PM  Result  Value Ref Range   Hgb A1c MFr Bld 5.3 4.8 - 5.6 %    Comment: (NOTE) Pre diabetes:          5.7%-6.4%  Diabetes:              >6.4%  Glycemic control for   <7.0% adults with diabetes    Mean Plasma Glucose 105.41 mg/dL    Comment: Performed at [facility], [address], [place], [place] [address]  Glucose, capillary     Status: Abnormal   Collection Time: 11/21/20 11:28 PM  Result Value Ref Range   Glucose-Capillary 292 (H) 70 - 99 mg/dL    Comment: Glucose reference range applies only to samples taken after fasting for at least 8 hours.  Glucose, capillary     Status: Abnormal   Collection Time: 11/22/20  3:42 AM  Result Value Ref Range   Glucose-Capillary 205 (H) 70 - 99 mg/dL    Comment: Glucose reference  range applies only to samples taken after fasting for at least 8 hours.  Magnesium     Status: None   Collection Time: 11/22/20  5:54 AM  Result Value Ref Range   Magnesium 2.3 1.7 - 2.4 mg/dL    Comment: Performed at [facility], [address], [place], [place] [address]  Phosphorus     Status: None   Collection Time: 11/22/20  5:54 AM  Result Value Ref Range   Phosphorus 3.2 2.5 - 4.6 mg/dL    Comment: Performed at [facility], [address] [place], [place] [address]  Glucose, capillary     Status: Abnormal   Collection Time: 11/22/20  7:39 AM  Result Value Ref Range   Glucose-Capillary 226 (H) 70 - 99 mg/dL    Comment: Glucose reference range applies only to samples taken after fasting for at least 8 hours.  Glucose, capillary     Status: Abnormal   Collection Time: 11/22/20 11:44 AM  Result Value Ref Range   Glucose-Capillary 239 (H) 70 - 99 mg/dL    Comment: Glucose reference range applies only to samples taken after fasting for at least 8 hours.  Magnesium     Status: Abnormal   Collection Time: 11/22/20  3:14 PM  Result Value Ref Range   Magnesium 2.5 (H) 1.7 - 2.4 mg/dL    Comment: Performed at [facility], [address] [place], [place] [address]  Phosphorus     Status: None   Collection Time: 11/22/20  3:14 PM  Result Value Ref Range   Phosphorus 2.8 2.5 - 4.6 mg/dL    Comment: Performed at [facility], [address] [place], [place] [address]  Basic metabolic panel Once     Status: Abnormal   Collection Time: 11/22/20  3:14 PM  Result Value Ref Range   Sodium 145 135 - 145 mmol/L   Potassium 4.3 3.5 - 5.1 mmol/L   Chloride 121 (H) 98 - 111 mmol/L   CO2 18 (L) 22 - 32 mmol/L   Glucose, Bld 238 (H) 70 - 99 mg/dL    Comment: Glucose reference range applies only to samples taken after fasting for at least 8 hours.   BUN 51 (H) 6 - 20 mg/dL   Creatinine, Ser 2.95 (H) 0.61 - 1.24 mg/dL   Calcium 7.5 (L) 8.9 - 10.3 mg/dL   GFR, Estimated 39 (L) >60 mL/min    Comment: (NOTE) Calculated using the CKD-EPI Creatinine Equation (2021)    Anion gap 6 5 - 15    Comment: Performed at [facility], [address], [place], [place] [address]  CBC Once     Status: Abnormal   Collection Time: 11/22/20  3:14 PM  Result Value Ref Range   WBC 18.4 (H) 4.0 - 10.5 K/uL   RBC 3.89 (L) 4.22 - 5.81 MIL/uL   Hemoglobin 12.6 (L) 13.0 - 17.0 g/dL   HCT 86.5 (L) 78.4 - 69.6 %   MCV 98.5 80.0 - 100.0 fL   MCH 32.4 26.0 - 34.0 pg   MCHC 32.9 30.0 - 36.0 g/dL   RDW 29.5 28.4 - 13.2 %   Platelets 183 150 - 400 K/uL   nRBC 0.0 0.0 - 0.2 %    Comment: Performed at [facility], [address] [place], [place] [address]  I-STAT 7, (LYTES, BLD GAS, ICA, H+H)     Status: Abnormal   Collection Time:  11/22/20  3:25 PM  Result Value Ref Range   pH, Arterial 7.349 (L) 7.350 - 7.450   pCO2 arterial 35.3 32.0 - 48.0 mmHg   pO2, Arterial 59 (L) 83.0 - 108.0 mmHg   Bicarbonate 19.3 (L) 20.0 - 28.0 mmol/L   TCO2 20 (L) 22 - 32 mmol/L   O2 Saturation 88.0 %   Acid-base deficit 5.0 (H) 0.0 - 2.0 mmol/L   Sodium 150 (H) 135 - 145 mmol/L   Potassium 4.6 3.5 - 5.1 mmol/L   Calcium, Ion 1.08 (L) 1.15 - 1.40 mmol/L    HCT 37.0 (L) 39.0 - 52.0 %   Hemoglobin 12.6 (L) 13.0 - 17.0 g/dL   Patient temperature 161.0100.2 F    Collection site Radial    Drawn by RT    Sample type ARTERIAL   Glucose, capillary     Status: Abnormal   Collection Time: 11/22/20  4:04 PM  Result Value Ref Range   Glucose-Capillary 210 (H) 70 - 99 mg/dL    Comment: Glucose reference range applies only to samples taken after fasting for at least 8 hours.  CK     Status: Abnormal   Collection Time: 11/22/20  4:45 PM  Result Value Ref Range   Total CK 821 (H) 49 - 397 U/L    Comment: Performed at [facility], [address] [place], [place] [id number]   DG CHEST PORT 1 VIEW  Result Date: 11/22/2020 CLINICAL DATA:  Pneumonia. EXAM: PORTABLE CHEST 1 VIEW COMPARISON:  Radiograph earlier today. FINDINGS: Endotracheal tube, enteric tube, and left subclavian central line remain in place. Lower lung volumes from earlier today. Increasing heterogeneous opacity at the right lung base with possible small right pleural effusion and fluid in the fissure. There is mild patchy opacity in the left retrocardiac region. No pneumothorax. Stable heart size and mediastinal contours. There is increasing gaseous gastric distention in the upper abdomen. IMPRESSION: 1. Increasing heterogeneous opacity at the right lung base with possible small right pleural effusion and fluid in the fissure. 2. Patchy opacity in the left retrocardiac region, atelectasis versus pneumonia. 3. Stable support apparatus. 4. Increasing gaseous gastric distention in the upper abdomen. Electronically Signed   By: Narda RutherfordMelanie  Sanford M.D.   On: 11/22/2020 17:07   DG CHEST PORT 1 VIEW  Result Date: 11/22/2020 CLINICAL DATA:  Check central line placement EXAM: PORTABLE CHEST 1 VIEW COMPARISON:  11/17/2020 FINDINGS: Endotracheal tube and feeding catheter are noted in satisfactory position. New left subclavian central line is seen in the mid superior vena cava. No pneumothorax is noted.  Increasing right basilar airspace opacity is noted medially consistent with early infiltrate. No bony abnormality is noted. IMPRESSION: No pneumothorax following central line placement. Early infiltrate in the medial right lung base. Electronically Signed   By: Alcide CleverMark  Lukens M.D.   On: 11/22/2020 02:08   DG Abd Portable 1V  Result Date: 11/21/2020 CLINICAL DATA:  Feeding tube placement EXAM: PORTABLE ABDOMEN - 1 VIEW COMPARISON:  07/26/2009 FINDINGS: Limited radiograph of the lower chest and upper abdomen was obtained for the purposes of enteric tube localization. Large-bore enteric tube is seen coursing below the diaphragm with distal tip terminating within the expected location of the gastric body. IMPRESSION: Large-bore enteric tube with distal tip terminating within the expected location of the gastric body. Electronically Signed   By: Duanne GuessNicholas  Plundo D.O.   On: 11/21/2020 14:21   VAS US TRANSCRANIAL DOPPLER  Result Date: 11/21/2020  Transcranial Doppler Patient  Name:  Barry Cherry.  Date of Exam:   11/21/2020 Medical Rec #: [id number]             Accession #:    [id number] Date of Birth: 12/25/1975            Patient Gender: M Patient Age:   21 years Exam Location:  [facility] Procedure:      VAS [place] TRANSCRANIAL DOPPLER Referring Phys: Autumn Patty --------------------------------------------------------------------------------  Indications: Subarachnoid hemorrhage. 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 Limitations for diagnostic windows: Unable to insonate left orbital window. Comparison Study: No prior study Performing Technologist: Sherren Kerns RVS  Examination Guidelines: A complete evaluation includes B-mode imaging, spectral Doppler, color Doppler, and power Doppler as needed of all accessible portions of each vessel. Bilateral testing is considered an integral part of a complete examination.  Limited examinations for reoccurring indications may be performed as noted.  +----------+-------------+----------+-----------+---------------------------+ RIGHT TCD Right VM (cm)Depth (cm)Pulsatility          Comment           +----------+-------------+----------+-----------+---------------------------+ MCA           58.00                 1.90                                +----------+-------------+----------+-----------+---------------------------+ ACA          -17.00                 1.35                                +----------+-------------+----------+-----------+---------------------------+ Term ICA      45.00                 1.65                                +----------+-------------+----------+-----------+---------------------------+ PCA           35.00                 1.55                                +----------+-------------+----------+-----------+---------------------------+ Opthalmic     25.00                 1.52                                +----------+-------------+----------+-----------+---------------------------+ ICA siphon                                  pt could not tolerate probe +----------+-------------+----------+-----------+---------------------------+ Vertebral                                           vent collar         +----------+-------------+----------+-----------+---------------------------+ Distal ICA    30.00  1.57                                +----------+-------------+----------+-----------+---------------------------+  +----------+------------+----------+-----------+---------------------------+ LEFT TCD  Left VM (cm)Depth (cm)Pulsatility          Comment           +----------+------------+----------+-----------+---------------------------+ MCA          62.00                 1.74                                +----------+------------+----------+-----------+---------------------------+ ACA           -24.00                2.46                                +----------+------------+----------+-----------+---------------------------+ Term ICA     38.00                 1.32                                +----------+------------+----------+-----------+---------------------------+ PCA          21.00                 1.66                                +----------+------------+----------+-----------+---------------------------+ Opthalmic                                  pt could not tolerate probe +----------+------------+----------+-----------+---------------------------+ ICA siphon                                 pt could not tolerate probe +----------+------------+----------+-----------+---------------------------+ Vertebral                                          vent collar         +----------+------------+----------+-----------+---------------------------+ Distal ICA   31.00       1.45                                          +----------+------------+----------+-----------+---------------------------+  +------------+-----+-----------+             VM cm  Comment   +------------+-----+-----------+ Prox Basilar     vent collar +------------+-----+-----------+ +----------------------+---+ Right Lindegaard Ratio1.9 +----------------------+---+ +---------------------+---+ Left Lindegaard Ratio2.0 +---------------------+---+    Preliminary     PMH:   Past Medical History:  Diagnosis Date   Anxiety    Depression    Hypertension    MI (myocardial infarction) [facility]) Feb 2014    PSH:   Past Surgical History:  Procedure Laterality Date   carpal tunnel release surgery     IR TRANSCATH/EMBOLIZ  12/11/2020   RADIOLOGY WITH ANESTHESIA N/A 12/09/2020   Procedure:  IR WITH ANESTHESIA;  Surgeon: Lisbeth Renshaw, MD;  Location: [facility] OR;  Service: Radiology;  Laterality: N/A;    Allergies:  Allergies  Allergen Reactions   Penicillins Anaphylaxis    Tramadol Other (See Comments)    Urinary Sx   Wellbutrin [Bupropion] Other (See Comments)    Insomnia, hallucinations, sob    Medications:   Prior to Admission medications   Medication Sig Start Date End Date Taking? Authorizing Provider  ibuprofen (ADVIL,MOTRIN) 200 MG tablet Take 800 mg by mouth every 6 (six) hours as needed for headache.   Yes [provider]    Discontinued Meds:   Medications Discontinued During This Encounter  Medication Reason   clonazePAM (KLONOPIN) 0.5 MG tablet Patient Preference   sertraline (ZOLOFT) 50 MG tablet Patient Preference   naproxen (NAPROSYN) 500 MG tablet Patient Preference   B Complex Vitamins (VITAMIN B COMPLEX PO) Patient Preference   aspirin 81 MG tablet Patient Preference   acetaminophen (TYLENOL) 500 MG tablet Patient Preference   fentaNYL in NS (29mcg/ml) infusion-PREMIX    0.9 %  sodium chloride infusion    phenylephrine (NEO-SYNEPHRINE) 20mg /NS premix infusion    pantoprazole (PROTONIX) injection 40 mg    levofloxacin (LEVAQUIN) IVPB 750 mg    norepinephrine (LEVOPHED) 4mg  in premix infusion     Social History:  reports that he has been smoking cigarettes. He has a 20.00 pack-year smoking history. He has never used smokeless tobacco. He reports current alcohol use. He reports that he does not use drugs.  Family History:  No family history on file.  Blood pressure (!) 132/59, pulse (!) 102, temperature (!) 101.5 F (38.6 C), temperature source Oral, resp. rate 19, weight 65.6 kg, SpO2 (!) 87 %. General appearance: slowed mentation Head: EVD Eyes: Pupils fixed at 66mm Neck: no adenopathy, no carotid bruit, supple, symmetrical, trachea midline, and thyroid not enlarged, symmetric, no tenderness/mass/nodules Back: symmetric, no curvature. ROM normal. No CVA tenderness. Resp: coarse BS Cardio: tachy GI: decr BS but soft Extremities: edema 1+ UE edema Skin: Skin color, texture, turgor normal. No  rashes or lesions       Emmanuela Ghazi, , MD 11/22/2020, 7:22 PM

## 2020-11-22 NOTE — Progress Notes (Addendum)
eLink Physician-Brief Progress Note Patient Name: Barry Cherry. DOB: 1975/11/15 MRN: [id number]   Date of Service  11/22/2020  HPI/Events of Note  Patient vomited black fluid.  eICU Interventions  Plan: Hold tube feeds. OGT to LIS. H/H STAT. Protonix IV load and IV infusion.      Intervention Category Major Interventions: Other:  Lenell Antu 11/22/2020, 11:01 PM

## 2020-11-22 NOTE — Progress Notes (Addendum)
Patient given 40 mg of lasix at 1509 with no uop. I&O patient with only 50 cc urine. Dr. Denese Killings notified.  Verbal order by Dr. Denese Killings to give 40 mg more of lasix.    1744 update: after giving the 40 mg lasix patient still no urine output. Dr. Denese Killings notified.

## 2020-11-22 NOTE — Progress Notes (Signed)
eLink Physician-Brief Progress Note Patient Name: Barry Cherry. DOB: 08-25-1975 MRN: [id number]   Date of Service  11/22/2020  HPI/Events of Note  Hypotension - BP = 109/55 on maximal dose of Phenylephrine IV infusion.  eICU Interventions  Plan" Add Norepinephrine IV infusion. Titrate to MAP > 65 and SBP > 110.     Intervention Category Major Interventions: Hypotension - evaluation and management  Tremar Wickens Dennard Nip 11/22/2020, 5:47 AM

## 2020-11-22 NOTE — Progress Notes (Signed)
eLink Physician-Brief Progress Note Patient Name: Barry Cherry. DOB: [birth year] MRN: [id number]   Date of Service  11/22/2020  HPI/Events of Note  CVL placed.   eICU Interventions  Will change Phenylephrine IV infusion orders to via CVL and increase ceiling to 400 mg/min.     Intervention Category Major Interventions: Hypotension - evaluation and management  Nazar Kuan Eugene 11/22/2020, 2:28 AM

## 2020-11-22 NOTE — Progress Notes (Signed)
eLink Physician-Brief Progress Note Patient Name: Barry Cherry. DOB: 1975/11/17 MRN: [id number]   Date of Service  11/22/2020  HPI/Events of Note  Hypotension - Patient on maximal dose of Phenylephrine via PIV. BP = 86/41 with MAP = 54 by A-line.   eICU Interventions  Plan: Bolus with 0.9 NaCl 1 liter IV over 1 hour now. Will notify PCCM ground team of need for CVL.     Intervention Category Major Interventions: Hypotension - evaluation and management  [name] Eugene 11/22/2020, 12:58 AM

## 2020-11-23 ENCOUNTER — Inpatient Hospital Stay (HOSPITAL_COMMUNITY): Payer: Medicaid Other

## 2020-11-23 DIAGNOSIS — J189 Pneumonia, unspecified organism: Secondary | ICD-10-CM

## 2020-11-23 DIAGNOSIS — I609 Nontraumatic subarachnoid hemorrhage, unspecified: Secondary | ICD-10-CM

## 2020-11-23 LAB — POCT I-STAT 7, (LYTES, BLD GAS, ICA,H+H)
Acid-base deficit: 15 mmol/L — ABNORMAL HIGH (ref 0.0–2.0)
Acid-base deficit: 15 mmol/L — ABNORMAL HIGH (ref 0.0–2.0)
Acid-base deficit: 20 mmol/L — ABNORMAL HIGH (ref 0.0–2.0)
Acid-base deficit: 21 mmol/L — ABNORMAL HIGH (ref 0.0–2.0)
Bicarbonate: 15.2 mmol/L — ABNORMAL LOW (ref 20.0–28.0)
Bicarbonate: 14.2 mmol/L — ABNORMAL LOW (ref 20.0–28.0)
Bicarbonate: 10.2 mmol/L — ABNORMAL LOW (ref 20.0–28.0)
Bicarbonate: 9.6 mmol/L — ABNORMAL LOW (ref 20.0–28.0)
Calcium, Ion: 0.99 mmol/L — ABNORMAL LOW (ref 1.15–1.40)
Calcium, Ion: 1.01 mmol/L — ABNORMAL LOW (ref 1.15–1.40)
Calcium, Ion: 0.85 mmol/L — CL (ref 1.15–1.40)
Calcium, Ion: 0.85 mmol/L — CL (ref 1.15–1.40)
HCT: 39 % (ref 39.0–52.0)
HCT: 39 % (ref 39.0–52.0)
HCT: 39 % (ref 39.0–52.0)
HCT: 40 % (ref 39.0–52.0)
Hemoglobin: 13.3 g/dL (ref 13.0–17.0)
Hemoglobin: 13.3 g/dL (ref 13.0–17.0)
Hemoglobin: 13.3 g/dL (ref 13.0–17.0)
Hemoglobin: 13.6 g/dL (ref 13.0–17.0)
O2 Saturation: 63 %
O2 Saturation: 71 %
O2 Saturation: 89 %
O2 Saturation: 86 %
Patient temperature: 98.8
Patient temperature: 96.3
Patient temperature: 32.6
Patient temperature: 90.5
Potassium: 5.4 mmol/L — ABNORMAL HIGH (ref 3.5–5.1)
Potassium: 5.2 mmol/L — ABNORMAL HIGH (ref 3.5–5.1)
Potassium: 3.9 mmol/L (ref 3.5–5.1)
Potassium: 3.9 mmol/L (ref 3.5–5.1)
Sodium: 153 mmol/L — ABNORMAL HIGH (ref 135–145)
Sodium: 152 mmol/L — ABNORMAL HIGH (ref 135–145)
Sodium: 153 mmol/L — ABNORMAL HIGH (ref 135–145)
Sodium: 153 mmol/L — ABNORMAL HIGH (ref 135–145)
TCO2: 17 mmol/L — ABNORMAL LOW (ref 22–32)
TCO2: 16 mmol/L — ABNORMAL LOW (ref 22–32)
TCO2: 11 mmol/L — ABNORMAL LOW (ref 22–32)
TCO2: 11 mmol/L — ABNORMAL LOW (ref 22–32)
pCO2 arterial: 50.3 mmHg — ABNORMAL HIGH (ref 32.0–48.0)
pCO2 arterial: 43.4 mmHg (ref 32.0–48.0)
pCO2 arterial: 31.6 mmHg — ABNORMAL LOW (ref 32.0–48.0)
pCO2 arterial: 31.3 mmHg — ABNORMAL LOW (ref 32.0–48.0)
pH, Arterial: 7.088 — CL (ref 7.350–7.450)
pH, Arterial: 7.115 — CL (ref 7.350–7.450)
pH, Arterial: 7.089 — CL (ref 7.350–7.450)
pH, Arterial: 7.065 — CL (ref 7.350–7.450)
pO2, Arterial: 45 mmHg — ABNORMAL LOW (ref 83.0–108.0)
pO2, Arterial: 46 mmHg — ABNORMAL LOW (ref 83.0–108.0)
pO2, Arterial: 60 mmHg — ABNORMAL LOW (ref 83.0–108.0)
pO2, Arterial: 56 mmHg — ABNORMAL LOW (ref 83.0–108.0)

## 2020-11-23 LAB — GLUCOSE, CAPILLARY
Glucose-Capillary: 101 mg/dL — ABNORMAL HIGH (ref 70–99)
Glucose-Capillary: 104 mg/dL — ABNORMAL HIGH (ref 70–99)
Glucose-Capillary: 126 mg/dL — ABNORMAL HIGH (ref 70–99)
Glucose-Capillary: 14 mg/dL — CL (ref 70–99)
Glucose-Capillary: 151 mg/dL — ABNORMAL HIGH (ref 70–99)
Glucose-Capillary: 209 mg/dL — ABNORMAL HIGH (ref 70–99)
Glucose-Capillary: 67 mg/dL — ABNORMAL LOW (ref 70–99)
Glucose-Capillary: 75 mg/dL (ref 70–99)
Glucose-Capillary: 76 mg/dL (ref 70–99)
Glucose-Capillary: 77 mg/dL (ref 70–99)

## 2020-11-23 LAB — RENAL FUNCTION PANEL
Albumin: 1.9 g/dL — ABNORMAL LOW (ref 3.5–5.0)
Albumin: 1.4 g/dL — ABNORMAL LOW (ref 3.5–5.0)
Anion gap: 28 — ABNORMAL HIGH (ref 5–15)
Anion gap: 32 — ABNORMAL HIGH (ref 5–15)
BUN: 42 mg/dL — ABNORMAL HIGH (ref 6–20)
BUN: 37 mg/dL — ABNORMAL HIGH (ref 6–20)
CO2: 12 mmol/L — ABNORMAL LOW (ref 22–32)
CO2: 9 mmol/L — ABNORMAL LOW (ref 22–32)
Calcium: 6.7 mg/dL — ABNORMAL LOW (ref 8.9–10.3)
Calcium: 6.3 mg/dL — CL (ref 8.9–10.3)
Chloride: 114 mmol/L — ABNORMAL HIGH (ref 98–111)
Chloride: 110 mmol/L (ref 98–111)
Creatinine, Ser: 2.93 mg/dL — ABNORMAL HIGH (ref 0.61–1.24)
Creatinine, Ser: 3.31 mg/dL — ABNORMAL HIGH (ref 0.61–1.24)
GFR, Estimated: 26 mL/min — ABNORMAL LOW (ref >60)
GFR, Estimated: 23 mL/min — ABNORMAL LOW (ref >60)
Glucose, Bld: 132 mg/dL — ABNORMAL HIGH (ref 70–99)
Glucose, Bld: 77 mg/dL (ref 70–99)
Phosphorus: 9.3 mg/dL — ABNORMAL HIGH (ref 2.5–4.6)
Phosphorus: >30 mg/dL — ABNORMAL HIGH (ref 2.5–4.6)
Potassium: 3.9 mmol/L (ref 3.5–5.1)
Potassium: 5.4 mmol/L — ABNORMAL HIGH (ref 3.5–5.1)
Sodium: 154 mmol/L — ABNORMAL HIGH (ref 135–145)
Sodium: 151 mmol/L — ABNORMAL HIGH (ref 135–145)

## 2020-11-23 LAB — CBC
HCT: 42.6 % (ref 39.0–52.0)
HCT: 40.8 % (ref 39.0–52.0)
Hemoglobin: 12.8 g/dL — ABNORMAL LOW (ref 13.0–17.0)
Hemoglobin: 12.4 g/dL — ABNORMAL LOW (ref 13.0–17.0)
MCH: 32.2 pg (ref 26.0–34.0)
MCH: 32.5 pg (ref 26.0–34.0)
MCHC: 30 g/dL (ref 30.0–36.0)
MCHC: 30.4 g/dL (ref 30.0–36.0)
MCV: 107.3 fL — ABNORMAL HIGH (ref 80.0–100.0)
MCV: 107.1 fL — ABNORMAL HIGH (ref 80.0–100.0)
Platelets: 109 10*3/uL — ABNORMAL LOW (ref 150–400)
Platelets: 82 10*3/uL — ABNORMAL LOW (ref 150–400)
RBC: 3.97 MIL/uL — ABNORMAL LOW (ref 4.22–5.81)
RBC: 3.81 MIL/uL — ABNORMAL LOW (ref 4.22–5.81)
RDW: 16 % — ABNORMAL HIGH (ref 11.5–15.5)
RDW: 16.4 % — ABNORMAL HIGH (ref 11.5–15.5)
WBC: 13.6 10*3/uL — ABNORMAL HIGH (ref 4.0–10.5)
WBC: 14.1 10*3/uL — ABNORMAL HIGH (ref 4.0–10.5)
nRBC: 11 % — ABNORMAL HIGH (ref 0.0–0.2)
nRBC: 11.7 % — ABNORMAL HIGH (ref 0.0–0.2)

## 2020-11-23 LAB — MAGNESIUM
Magnesium: 2.6 mg/dL — ABNORMAL HIGH (ref 1.7–2.4)
Magnesium: 3 mg/dL — ABNORMAL HIGH (ref 1.7–2.4)

## 2020-11-23 LAB — OCCULT BLOOD GASTRIC / DUODENUM (SPECIMEN CUP): Occult Blood, Gastric: POSITIVE — AB

## 2020-11-23 LAB — POCT I-STAT EG7
Acid-base deficit: 15 mmol/L — ABNORMAL HIGH (ref 0.0–2.0)
Bicarbonate: 15.9 mmol/L — ABNORMAL LOW (ref 20.0–28.0)
Calcium, Ion: 0.88 mmol/L — CL (ref 1.15–1.40)
HCT: 34 % — ABNORMAL LOW (ref 39.0–52.0)
Hemoglobin: 11.6 g/dL — ABNORMAL LOW (ref 13.0–17.0)
O2 Saturation: 67 %
Patient temperature: 34.4
Potassium: 4.1 mmol/L (ref 3.5–5.1)
Sodium: 155 mmol/L — ABNORMAL HIGH (ref 135–145)
TCO2: 18 mmol/L — ABNORMAL LOW (ref 22–32)
pCO2, Ven: 53.4 mmHg (ref 44.0–60.0)
pH, Ven: 7.063 — CL (ref 7.250–7.430)
pO2, Ven: 42 mmHg (ref 32.0–45.0)

## 2020-11-23 LAB — COMPREHENSIVE METABOLIC PANEL
ALT: 598 U/L — ABNORMAL HIGH (ref 0–44)
AST: 1013 U/L — ABNORMAL HIGH (ref 15–41)
Albumin: 2 g/dL — ABNORMAL LOW (ref 3.5–5.0)
Alkaline Phosphatase: 106 U/L (ref 38–126)
Anion gap: 23 — ABNORMAL HIGH (ref 5–15)
BUN: 47 mg/dL — ABNORMAL HIGH (ref 6–20)
CO2: 12 mmol/L — ABNORMAL LOW (ref 22–32)
Calcium: 7.1 mg/dL — ABNORMAL LOW (ref 8.9–10.3)
Chloride: 117 mmol/L — ABNORMAL HIGH (ref 98–111)
Creatinine, Ser: 3.03 mg/dL — ABNORMAL HIGH (ref 0.61–1.24)
GFR, Estimated: 25 mL/min — ABNORMAL LOW (ref >60)
Glucose, Bld: <20 mg/dL — CL (ref 70–99)
Potassium: 4.7 mmol/L (ref 3.5–5.1)
Sodium: 152 mmol/L — ABNORMAL HIGH (ref 135–145)
Total Bilirubin: 2 mg/dL — ABNORMAL HIGH (ref 0.3–1.2)
Total Protein: 4.6 g/dL — ABNORMAL LOW (ref 6.5–8.1)

## 2020-11-23 LAB — HEMOGLOBIN AND HEMATOCRIT, BLOOD
HCT: 39.2 % (ref 39.0–52.0)
Hemoglobin: 12.1 g/dL — ABNORMAL LOW (ref 13.0–17.0)

## 2020-11-23 LAB — TRIGLYCERIDES: Triglycerides: 90 mg/dL (ref <150)

## 2020-11-23 LAB — CORTISOL: Cortisol, Plasma: >100 ug/dL

## 2020-11-23 LAB — PHOSPHORUS: Phosphorus: 9.2 mg/dL — ABNORMAL HIGH (ref 2.5–4.6)

## 2020-11-23 MED ORDER — SODIUM BICARBONATE 8.4 % IV SOLN
150.0000 meq | Freq: Once | INTRAVENOUS | Status: AC
  Administered 2020-11-23: 150 meq via INTRAVENOUS

## 2020-11-23 MED ORDER — SODIUM BICARBONATE 8.4 % IV SOLN
INTRAVENOUS | Status: AC
  Filled 2020-11-23: qty 100

## 2020-11-23 MED ORDER — DEXTROSE 50 % IV SOLN
1.0000 | Freq: Once | INTRAVENOUS | Status: AC
  Administered 2020-11-23: 50 mL via INTRAVENOUS
  Filled 2020-11-23: qty 50

## 2020-11-23 MED ORDER — DEXTROSE 50 % IV SOLN
2.0000 | Freq: Once | INTRAVENOUS | Status: AC
  Administered 2020-11-23: 100 mL via INTRAVENOUS

## 2020-11-23 MED ORDER — SODIUM BICARBONATE 8.4 % IV SOLN
100.0000 meq | Freq: Once | INTRAVENOUS | Status: AC
  Administered 2020-11-23: 100 meq via INTRAVENOUS

## 2020-11-23 MED ORDER — NEPRO/CARBSTEADY PO LIQD
1000.0000 mL | ORAL | Status: DC
  Filled 2020-11-23 (×2): qty 1000

## 2020-11-23 MED ORDER — VANCOMYCIN VARIABLE DOSE PER UNSTABLE RENAL FUNCTION (PHARMACIST DOSING): Status: DC

## 2020-11-23 MED ORDER — CALCIUM GLUCONATE-NACL 1-0.675 GM/50ML-% IV SOLN
1.0000 g | Freq: Once | INTRAVENOUS | Status: AC
  Administered 2020-11-23: 1000 mg via INTRAVENOUS
  Filled 2020-11-23: qty 50

## 2020-11-23 MED ORDER — VANCOMYCIN HCL IN DEXTROSE 1-5 GM/200ML-% IV SOLN: 1000.0000 mg | INTRAVENOUS | Status: DC

## 2020-11-23 MED ORDER — VANCOMYCIN HCL 1250 MG/250ML IV SOLN
1250.0000 mg | Freq: Once | INTRAVENOUS | Status: AC
  Administered 2020-11-23: 1250 mg via INTRAVENOUS
  Filled 2020-11-23: qty 250

## 2020-11-23 MED ORDER — METRONIDAZOLE 500 MG/100ML IV SOLN
500.0000 mg | Freq: Three times a day (TID) | INTRAVENOUS | Status: DC
  Administered 2020-11-23 (×3): 500 mg via INTRAVENOUS
  Filled 2020-11-23 (×3): qty 100

## 2020-11-23 MED ORDER — VANCOMYCIN HCL 750 MG/150ML IV SOLN: 750.0000 mg | INTRAVENOUS | Status: DC

## 2020-11-23 MED ORDER — SODIUM CHLORIDE 0.9 % IV SOLN: 2.0000 g | INTRAVENOUS | Status: DC

## 2020-11-23 MED ORDER — EPINEPHRINE HCL 5 MG/250ML IV SOLN IN NS
0.5000 ug/min | INTRAVENOUS | Status: DC
  Administered 2020-11-23 (×3): 20 ug/min via INTRAVENOUS
  Administered 2020-11-23: 1 ug/min via INTRAVENOUS
  Administered 2020-11-23: 20 ug/min via INTRAVENOUS
  Filled 2020-11-23 (×5): qty 250

## 2020-11-23 MED ORDER — DEXTROSE 50 % IV SOLN
INTRAVENOUS | Status: AC
  Filled 2020-11-23: qty 100

## 2020-11-23 MED ORDER — METHYLENE BLUE 0.5 % INJ SOLN
1.0000 mg/kg | Freq: Once | Status: AC
  Administered 2020-11-23: 65.5 mg via INTRAVENOUS
  Filled 2020-11-23: qty 13.1

## 2020-11-23 MED ORDER — PANTOPRAZOLE SODIUM 40 MG IV SOLR
40.0000 mg | Freq: Two times a day (BID) | INTRAVENOUS | Status: DC
  Administered 2020-11-23 (×3): 40 mg via INTRAVENOUS
  Filled 2020-11-23 (×3): qty 40

## 2020-11-23 MED ORDER — SODIUM BICARBONATE 8.4 % IV SOLN
INTRAVENOUS | Status: DC
  Filled 2020-11-23 (×4): qty 1000

## 2020-11-24 LAB — PATHOLOGIST SMEAR REVIEW

## 2020-11-25 LAB — CULTURE, RESPIRATORY W GRAM STAIN

## 2020-12-06 ENCOUNTER — Encounter (HOSPITAL_COMMUNITY): Payer: Self-pay

## 2020-12-06 ENCOUNTER — Other Ambulatory Visit (HOSPITAL_COMMUNITY): Payer: Self-pay | Admitting: Neurosurgery

## 2020-12-06 DIAGNOSIS — I729 Aneurysm of unspecified site: Secondary | ICD-10-CM

## 2020-12-06 HISTORY — PX: IR ANGIOGRAM FOLLOW UP STUDY: IMG697

## 2020-12-15 NOTE — Progress Notes (Signed)
MD made aware of pts peak pressures increasing. No new orders at this time.

## 2020-12-15 NOTE — Progress Notes (Signed)
Patient ID: Ayaansh Smail., male   DOB: [birth year], 45 y.o.   MRN: [id number]   NAME:  Vedansh Kerstetter., MRN:  [id number], DOB:  04-19-1975, LOS: 4 ADMISSION DATE:  12/03/2020, CONSULTATION DATE:  11/29/2020 REFERRING MD:  Maurice Small, CHIEF COMPLAINT:  AMS   History of Present Illness:  Mr. Soto is a 45 year old man who presented to [facility] on 8/5/2 afternoon with altered mental status and reported overdose.  He reportedly (per EMS) snorted an unknown drug.   Initially was given narcan with some improvement.   UDS postive for amphetamines. Was actually discharged on 8/6 from ed, returned in the early afternoon, two hours (per note times) after discharge with worsening mental status ,found unresponsive by his girlfriend who has taken him home. 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  Intubated for airway protection at [facility] prior to transfer to [facility] neurosurgery. Nicardipine started for SBP control Per neurosurgery note: "CTA shows Acomm aneurysm, possible Pcomm vs infundibulum. Hunt-Hess 3 at presentation before hydrocephalus."   EVD placed by neurosurgeon at the bedside  Pertinent  Medical History  Polysubstance use with UDS + for amphetamines   Significant Hospital Events: Including procedures, antibiotic start and stop dates in addition to other pertinent events   8/6: admitted, intubated, EVD placed 8/7: aneurysmal coiling completed 8/8: d/c sedation 8/9: hypotensive overnight requiring max dose phenylephrine and norepinephrine. New lack of response in upper extremities.  8/9: overnight maxed out pressors, hypoxic to 80%, and 1 episode of hematemesis with stable hgb. CRRT started.  8/10: Max dose phenylephrine, norepinephrine, epinephrine, and vasopressin. Added methylene blue. Patient made DNR and no escalation of care decided on by family.   Interim History / Subjective:  Yesterday evening was started on CRRT. Overnight hypotensive requiring max  dose of phenylephrine, norepinephrine, epinephrine, and vasopressin.Hypoxic to 80% as well Peep increased up to 12 and FiO2 at 100%. Also 1 episode of hematemesis (black fluid, positive occult blood) with stable hemoglobin.  Also hypoglycemic to 14 with follow up glucose 151.   Objective   Blood pressure (!) 82/55, pulse 71, temperature (!) 90.14 F (32.3 C), resp. rate (!) 34, weight 65.6 kg, SpO2 98 %. CVP:  [8 mmHg-25 mmHg] 14 mmHg  Vent Mode: PRVC FiO2 (%):  [50 %-100 %] 100 % Set Rate:  [18 bmp-34 bmp] 34 bmp Vt Set:  [450 mL-550 mL] 550 mL PEEP:  [5 cmH20-10 cmH20] 8 cmH20 Plateau Pressure:  [16 cmH20-29 cmH20] 29 cmH20   Intake/Output Summary (Last 24 hours) at 11/27/2020 0905 Last data filed at 11/26/2020 0900 Gross per 24 hour  Intake 6976.52 ml  Output 3934 ml  Net 3042.52 ml   Filed Weights   11/22/20 0500  Weight: 65.6 kg    Examination: General: Thin appearing middle aged male. Lying in bed mechanically intubated.  HENT: Incision just to the right of midline of skull from surgery. No surrounding edema, redness, or drainage.  Lungs: CTA bilaterally, mechanically intubated Cardiovascular: RRR, no m/r/g Abdomen: soft, non tender, non distended, bowel sounds present  Extremities: warm, dry, well perfused, generalized edema present  Neuro: Grimaces to noxious stimuli, but does not move any extremities, pupils equal and sluggishly reactive to light bilaterally, pupils small. 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 However, patient was not alert enough to work towards extubation yesterday and fentanyl was restarted.  Overnight 8/8, patient had hypotension requiring max phenylephrine and some norepinephrine as well.  Patient also not moving upper extremities on exam morning of 8/9, and per nursing was not moving upper extremitties overnight either. Discussed with neurosurgery and decision was made to hold off on head imaging for now.   - hemodynamic augmentation  - monitor for signs of vasospasm - continue nimodipine - continue keppra for seizure ppx - neurosurgery following  - trans cranial doppler MWF  Hypotension - sepsis secondary to aspiration PNA v vasopressor tolerance in setting of methamphetamine use  Overnight 8/8, patient had hypotension requiring max phenylephrine and some norepinephrine as well.   Overnight 8/9, patient had continued worsening hypotension requiring 4 vasopressors (norepinephrine, phenylephrine, epinephrine, and vasopressin).  Bedside echo 8/9 showed normal cardiac function and IVC not varied with respiration. Low concern for volume or cardiogenic induced hypotension. Neurologic source of hypotension also considered unlikely.  Hypotension thought to be secondary to vasodilation.  Worsening leukocytosis, increasing oxygen requirements and dropping O2 sats over course of 8/9 supportive of aspiration PNA. CXR overnight also showed worsening bilateral pulmonary consolidation, concerning for ARDS v bilateral PNA. Gram stain of respiratory culture showed abundant G negative rods, moderate G positive cocci, and few gram variable rods. Culture growing S. Aureus and S. Pneumoniae.  Through literature review yesterday found that methamphetamine use is associated with increased pressor requirements. Literature also showed case reports of patients with vasogenic shock and recent methamphetamine use that is responsive to methylene blue. This is a possible explanation for persistent hypotension in this patient, however given overall clinical picture favor septic shock secondary to aspiration PNA as largest contributor.   - methylene blue infusion - antibiotic coverage widened to include cefepime  and vancomycin for MRSA coverage and flagyl for anaerobic coverage  - continue vasopressors - trend CBC - CVP monitoring - follow respiratory culture susceptibilities   Acute respiratory failure 2/2 SAH v sepsis secondary to aspiration PNA Worsening O2 requirements. Sats dropping to 80s intermittently. Fio2 and 100%, PEEP currently at 8, but up to 12 overnight. Work up and management for aspiration PNA as above.  - continue lung protective ventilation - VAP bundle  Anion gap metabolic acidosis  Worsening acidosis. Thought to be secondary to sepsis.  - trend ABG - bicarb continuous infusion  - bicarb 50 meq IV x2  Acute renal failure  Creatinine increased to 3.03<<1.31<<0.97. Initially thought to be due to continuous NS infusion, but over course of 8/9 patient not making much urine and nephrology was consulted and CRRT was started. CRRT clotted this AM.  - trend daily BMP - given active decompensation and decision not to escalate care will hold off on restarting CRRT at this time and reassess later  - nephrology consulted    Hematemesis Patient had an episode of black hematemesis last night. Follow up hgb was 12.1. Protonix IV load given and infusion started. Likely stress ulcer.  - patient too unstable to scope at this time - hgb stable - trend CBC  - continue Protonix   Hypocalcemia Hypernatremia Hyperchloremia S/P CRRT.  - trend electrolytes - replete as needed - given active decompensation and decision not to escalate care will hold off on restarting CRRT at this time and reassess later   Transaminitis Likely shock liver in setting of acute active decompensation.  - no further  work up at this time in setting of no escalation of care per family  - supportive care  Hyperglycemia - SSI  Hypoglycemia Down to 14 last night. Received D5 infusion with follow up glucose 151.  - trend CBG - sensitive SSI   New grade 1 diastolic HF Echo showed LV EF = > 75% with mild  eccentric LV hypertrophy of posterior-lateral segment. Small pericardial effusion also present posterior to LV.  No intracardiac source of embolism identified.  - outpatient cardiology follow up   Polysubstance use Patient had UDS positive for amphetamines and is a current tobacco smoker. No evidence of withdrawal thus far.  - monitor for withdrawal symptoms   Best Practice (right click and "Reselect all SmartList Selections" daily)   Diet/type: tubefeeds DVT prophylaxis: SCD GI prophylaxis: PPI Lines: Central line Foley:  N/A Code Status:  full code Last date of multidisciplinary goals of care discussion [ 26/10: spoke with family and given drastic decline and poor prognosis family deiced to make patient DNR and decide no escalation of care.   Labs   CBC: Recent Labs  Lab Dec 13, 2020 1420 2020-12-13 2000 11/21/20 0657 11/22/20 1514 11/22/20 1525 11/18/2020 0406 11/30/2020 0440 11/16/2020 0517 11/28/2020 0700 11/26/2020 0756  WBC 21.0*  --  22.5* 18.4*  --  13.6*  --  14.1*  --   --   NEUTROABS 16.1*  --   --   --   --   --   --   --   --   --   HGB 16.8   < > 14.6 12.6*   < > 12.8* 11.6* 12.4* 13.3 13.6  HCT 47.6   < > 44.0 38.3*   < > 42.6 34.0* 40.8 39.0 40.0  MCV 95.6  --  99.1 98.5  --  107.3*  --  107.1*  --   --   PLT 188  --  145* 183  --  109*  --  82*  --   --    < > = values in this interval not displayed.    Basic Metabolic Panel: Recent Labs  Lab 2020/12/13 1420 12/13/20 2000 11/21/20 0657 11/22/20 0554 11/22/20 1514 11/22/20 1525 12/14/2020 0406 11/29/2020 0440 12/09/2020 0517 12/11/2020 0700 12/03/2020 0756  NA 143   < > 144  --  145   < > 152* 155* 154* 153* 153*  K 3.8   < > 4.1  --  4.3   < > 4.7 4.1 3.9 3.9 3.9  CL 106  --  114*  --  121*  --  117*  --  114*  --   --   CO2 27  --  20*  --  18*  --  12*  --  12*  --   --   GLUCOSE 193*  --  116*  --  238*  --  <20*  --  132*  --   --   BUN 21*  --  27*  --  51*  --  47*  --  42*  --   --   CREATININE 0.97  --  1.31*   --  2.11*  --  3.03*  --  2.93*  --   --   CALCIUM 9.3  --  7.5*  --  7.5*  --  7.1*  --  6.7*  --   --   MG  --   --  1.9 2.3 2.5*  --   --   --  2.6*  --   --  PHOS  --   --  4.8* 3.2 2.8  --   --   --  9.3*  9.2*  --   --    < > = values in this interval not displayed.   GFR: Estimated Creatinine Clearance: 25.9 mL/min (A) (by C-G formula based on SCr of 2.93 mg/dL (H)). Recent Labs  Lab 11/21/20 0657 11/22/20 1514 11/21/2020 0406 12/14/2020 0517  WBC 22.5* 18.4* 13.6* 14.1*    Liver Function Tests: Recent Labs  Lab 11/17/2020 1420 11/21/20 0657 11/24/2020 0406 12/08/2020 0517  AST 97* 88* 1,013*  --   ALT 102* 70* 598*  --   ALKPHOS 126 88 106  --   BILITOT 1.5* 2.0* 2.0*  --   PROT 7.8 5.8* 4.6*  --   ALBUMIN 3.7 2.5* 2.0* 1.9*   No results for input(s): LIPASE, AMYLASE in the last 168 hours. No results for input(s): AMMONIA in the last 168 hours.  ABG    Component Value Date/Time   PHART 7.065 (LL) 11/15/2020 0756   PCO2ART 31.3 (L) 12/03/2020 0756   PO2ART 56 (L) 11/21/2020 0756   HCO3 9.6 (L) 12/08/2020 0756   TCO2 11 (L) 12/11/2020 0756   ACIDBASEDEF 21.0 (H) 12/06/2020 0756   O2SAT 86.0 12/08/2020 0756     Coagulation Profile: Recent Labs  Lab 11/14/2020 1420  INR 1.1    Cardiac Enzymes: Recent Labs  Lab 12/11/2020 1420 11/22/20 1645  CKTOTAL 256 821*    HbA1C: Hgb A1c MFr Bld  Date/Time Value Ref Range Status  11/21/2020 08:52 PM 5.3 4.8 - 5.6 % Final    Comment:    (NOTE) Pre diabetes:          5.7%-6.4%  Diabetes:              >6.4%  Glycemic control for   <7.0% adults with diabetes     CBG: Recent Labs  Lab 11/16/2020 0025 12/03/2020 0346 11/22/2020 0424 11/14/2020 0516 12/06/2020 0805  GLUCAP 77 14* 151* 126* 104*    Review of Systems:   Patient intubated and stuporous. Unable to complete ROS.   Past Medical History:  He,  has a past medical history of Anxiety, Depression, Hypertension, and MI (myocardial infarction) [facility]) (Feb  2014).   Surgical History:   Past Surgical History:  Procedure Laterality Date   carpal tunnel release surgery     IR TRANSCATH/EMBOLIZ  December 06, 2020   RADIOLOGY WITH ANESTHESIA N/A 2020/12/06   Procedure: IR WITH ANESTHESIA;  Surgeon: Lisbeth Renshaw, MD;  Location: [facility] OR;  Service: Radiology;  Laterality: N/A;     Social History:   reports that he has been smoking cigarettes. He has a 20.00 pack-year smoking history. He has never used smokeless tobacco. He reports current alcohol use. He reports that he does not use drugs.   Family History:  His family history is not on file.   Allergies Allergies  Allergen Reactions   Penicillins Anaphylaxis   Tramadol Other (See Comments)    Urinary Sx   Wellbutrin [Bupropion] Other (See Comments)    Insomnia, hallucinations, sob     Home Medications  Prior to Admission medications   Medication Sig Start Date End Date Taking? Authorizing Provider  ibuprofen (ADVIL,MOTRIN) 200 MG tablet Take 800 mg by mouth every 6 (six) hours as needed for headache.   Yes [provider]     Critical care time:       Patient ID: Steffanie Rainwater., male   DOB:  [birth year], 45 y.o.   MRN: [id number]

## 2020-12-15 NOTE — Progress Notes (Signed)
[facility] Progress Note  Name: Barry Cherry. MRN: [id number] DOB: 1975-05-10  Chief Complaint:  AMS  Subjective:  he has been on CRRT and that just clotted.  He had 1 liter UOP over 8/9 and had 1.1 liters UF with CRRT over 8/9 as charted.  He also had 1.4 liters of emesis/NG output charted.  Per nurse family has made decision to not escalate care and he is DNR.  Family is at the bedside  Review of systems:  Unable to obtain secondary to intubated  ------  Background on consult:  Jennie Bolar. is an 45 y.o. male HTN CASHD presenting initially to an OSH with AMS for a reported unknown drug  overdose but later positive for amphetamines and seen by neurosurgery after returning to the ED. He was found to have a large SAH  with ventriculomegaly and intubated for airway protection + nicardipine gtt + EVD. 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 He had a successful coil embolization of the anterior communicating artery on 11/15/2020 requiring 150 mL of iohexol. Patient is on Levophed, Neo-synephrine, and Vasopressin with escalating  vasopressor support required + broad spectrum antibiotics (Cefepime and Levaquin) for possible pneumonia and sepsis.  Patient received Lasix 40mg  -> 100mg  x2 on the day of consultation with poor response.  Patient with only 750 ml UOP over the past 24hrs with a BUN/Cr 51/2.11 and SNa 150. Patient is net positive 9.7 L during this hospitalization.   Intake/Output Summary (Last 24 hours) at 2020-12-22 0956 Last data filed at 12/22/2020 0900 Gross per 24 hour  Intake 6976.52 ml  Output 3934 ml  Net 3042.52 ml    Vitals:  Vitals:   December 22, 2020 0700 12-22-20 0715 12/22/20 0754 12/22/20 0800  BP: (!) 96/52 (!) 87/65 (!) 109/40 (!) 82/55  Pulse: 74 72 71   Resp: (!) 30 (!) 26 (!) 34 (!) 34  Temp: (!) 90.68 F (32.6 C) (!) 90.5 F (32.5 C)  (!) 90.14  F (32.3 C)  TempSrc:      SpO2: 98% 97% 98%   Weight:         Physical Exam:  General adult male in bed critically ill  Lungs intubated no air hunger Heart regular rate and rhythm Abdomen soft nondistended Extremities trace to 1+ edema CRRT machine at bedside - now off    Medications reviewed   Labs:  BMP Latest Ref Rng & Units 2020-12-22 2020/12/22 2020/12/22  Glucose 70 - 99 mg/dL - - 01/23/2021)  BUN 6 - 20 mg/dL - - 01/23/2021)  Creatinine 478(G - 1.24 mg/dL - - 95(A)  Sodium 2.13 - 145 mmol/L 153(H) 153(H) 154(H)  Potassium 3.5 - 5.1 mmol/L 3.9 3.9 3.9  Chloride 98 - 111 mmol/L - - 114(H)  CO2 22 - 32 mmol/L - - 12(L)  Calcium 8.9 - 10.3 mg/dL - - 6.7(L)     Assessment/Plan:   Acute renal failure - secondary to ATN/hypotension as well as in the setting of contrast + infectious process. minimal output failing challenge with higher dose Lasix and high FiO2 requirement.  Started CRRT overnight 8/9 and this has just clotted.   - Family has made the decision not to escalate care and the patient is actively decompensating.  His family is at the bedside.   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  Per neurosurgery Distributive shock -aspiration PNA. Antibiotics and pressors per critical care  Acute  hypoxic/hypercarbic respiratory failure- vent per critical care  Metabolic acidosis - s/p CRRT and not escalating care +UDS for amphetamines. Hypernatremia - s/p CRRT and not escalating care Hyperphosphatemia - s/p CRRT Hypocalcemia - replete  Transaminitis possible shock liver - actively decompensating   Family at the bedside and he is on multiple pressors.  CRRT has just clotted and they are not escalating care.  RN has confirmed she has PRN for pain if needed and they are in communication with his team.  Estanislado Emms, MD 12/07/2020 9:56 AM

## 2020-12-15 NOTE — Progress Notes (Signed)
Date and time results received: Nov 27, 2020 1830   Test: Ca Critical Value: 6.3  Name of Provider Notified: Dr. Denese Killings [id number]  Orders Received? Or Actions Taken?:  No new orders at this time.  Pt now DNR -- will not escalate care at this time.

## 2020-12-15 NOTE — Progress Notes (Signed)
Pt's BP rapidly declining despite our efforts.  As well as his heart rhythm.  Sisters and father at bedside.

## 2020-12-15 NOTE — Progress Notes (Signed)
Pt continues to have high peak pressures from gumming the et tube.  I inserted a bite block to help with that.  I will continue to monitor.

## 2020-12-15 NOTE — Progress Notes (Signed)
CRRT resumed at this time.  Pt still on max pressors but will attempt to filter blood at this time even if it is to keep pt net even.

## 2020-12-15 NOTE — Progress Notes (Signed)
CRRT filter clotted twice this morning.  Pt rapidly declining.  Dr. Denese Killings states to not restart.  Will reassess depending on pt's prognosis in a little while.

## 2020-12-15 NOTE — Progress Notes (Signed)
Pharmacy Antibiotic Note  Barry Cherry. is a 45 y.o. male admitted on 11/22/2020 with sepsis.  Pharmacy has been consulted for vancomycin dosing.  Plan: Vancomycin 1250 mg IV x 1 then 750 mg IV q24 hours F/u renal function, cultures and clinical progress  Weight: 65.6 kg (144 lb 10 oz)  Temp (24hrs), Avg:97.6 F (36.4 C), Min:95 F (35 C), Max:102.6 F (39.2 C)  Recent Labs  Lab 11/20/2020 1420 11/21/20 0657 11/22/20 1514  WBC 21.0* 22.5* 18.4*  CREATININE 0.97 1.31* 2.11*    Estimated Creatinine Clearance: 36 mL/min (A) (by C-G formula based on SCr of 2.11 mg/dL (H)).    Allergies  Allergen Reactions   Penicillins Anaphylaxis   Tramadol Other (See Comments)    Urinary Sx   Wellbutrin [Bupropion] Other (See Comments)    Insomnia, hallucinations, sob    Thank you for allowing pharmacy to be a part of this patient's care.  Talbert Cage Poteet 2020-12-04 4:35 AM

## 2020-12-15 NOTE — Progress Notes (Addendum)
PCCM INTERVAL PROGRESS NOTE  Called to bedside for worsening shock and oxygenation. Briefly this is a 45 year old male admitted with SAH/ICH now with worsening shock over the course of the evening. We have been to bedside several times. The patient is now on norepi, phenylephrine, epi, and vaso. We have had to adjust ventilator settings multiple times due to refractory acidosis and hypoxia. The patient is likely in ARDS at this point secondary to infection vs volume vs intracranial insult, however, he is not stable enough to tolerate traditional ARDS settings/Sedation. He would code for sure if those changes were made. Not stable for imaging beyond CXR, which is worsening. POCUS shows robust LV function with no pericardial effusion. Etiology for his overall worsening is really not clear.    Acute hypoxic/hypercarbic respiratory failure Distributive shock Acute renal failure SAH/ICH  Plan: - broaden antibiotics to include MRSA and anaerobe coverage - Unable to escalate pressors further - Unable to escalate ventilator further - CRRT ongoing, pullling 50cc/hr. Hesitant to back off this.  - Send co-ox  I have updated the patient's father who is aware his prognosis is quite poor.    Critical care time 45 minutes.   Joneen Roach, AGACNP-BC [facility]  See Amion for personal pager PCCM on call pager [phone] until 7pm. Please call Elink 7p-7a. [phone]  11/28/2020 4:22 AM

## 2020-12-15 NOTE — Progress Notes (Signed)
Pharmacy Antibiotic Note  Barry Cherry. is a 45 y.o. male admitted on 02-Dec-2020 with sepsis.  Pharmacy has been consulted for vancomycin dosing.  Of note, patient's CRRT filter clotted today. Currently not planning to restart CRRT.   Plan: D/c scheduled vancomycin order and f/u random vancomycin level in AM to assess clearance Decrease Cefepime to 2 gm IV Q 24 hours  Continue flagyl per MD  F/u renal function, cultures and clinical progress  Weight: 65.6 kg (144 lb 10 oz)  Temp (24hrs), Avg:93.6 F (34.2 C), Min:90.14 F (32.3 C), Max:102.6 F (39.2 C)  Recent Labs  Lab December 02, 2020 1420 11/21/20 0657 11/22/20 1514 11/25/2020 0406 12/01/2020 0517  WBC 21.0* 22.5* 18.4* 13.6* 14.1*  CREATININE 0.97 1.31* 2.11* 3.03* 2.93*     Estimated Creatinine Clearance: 25.9 mL/min (A) (by C-G formula based on SCr of 2.93 mg/dL (H)).    Allergies  Allergen Reactions   Penicillins Anaphylaxis   Tramadol Other (See Comments)    Urinary Sx   Wellbutrin [Bupropion] Other (See Comments)    Insomnia, hallucinations, sob   Levoflox 8/8>>8/9 Cefepime 8/9>> Vanc 8/10 >> Flagyl 8/10 >>    8/8 TA >> abudnant strep pnuemo (pan -S); abundant staph aureus   Thank you for allowing pharmacy to be a part of this patient's care.  Vinnie Level, PharmD., BCPS, BCCCP Clinical Pharmacist Please refer to [facility] for unit-specific pharmacist

## 2020-12-15 NOTE — Progress Notes (Signed)
At 1900 shift change, pupils assessed with day shift RN and noticed to be 6, irregular and non- reactive. Before they were 3, sluggish and equal. Dr. Franky Macho made aware no new orders at this time.

## 2020-12-15 NOTE — Progress Notes (Signed)
Pt did not tolerate CRRT. 20 min in and significant drop in BP.  MAP down to 38.  CCM notified.  Will not restart for now.

## 2020-12-15 NOTE — Progress Notes (Signed)
Pt rapidly declined overnight requiring CRRT and max dosing of 4 vasopressors.  Pt now on 100% FiO2 on ventilator with a PEEP of 8 and RR of 34.   Dr. Denese Killings spoke with pt's dad, Michele Mcalpine, who is currently at the bedside.  He updated dad on the chain of events from overnight and severity of the situation.  Dr. Denese Killings and Michele Mcalpine in agree ance that we will not be escalating care any further than we have thus far.  We are trying methylene blue and giving that time to potentially help.    Pt's sisters now at bedside spending time with their brother.  Myself and the medical student updated them on the situation and prognosis.

## 2020-12-15 NOTE — Progress Notes (Signed)
  NEUROSURGERY PROGRESS NOTE   Pts condition progressively and severely declined overnight. Has required escalitng doses of multiple pressors, max vent support with continued desat, and CRRT for ARF.   EXAM:  BP (!) 95/49   Pulse 89   Temp (!) 91.04 F (32.8 C)   Resp (!) 35   Wt 65.6 kg   SpO2 95%   BMI 25.63 kg/m   Intubated Pupils minimally reactive Breathes over vent No motor responses  IMPRESSION:  45 y.o. male SAH d# 4 s/p Acom coiling. Likely has underlying disease causing now severe multi-organ system failure. Certainly would not expect his SAH to be the underlying cause of this. Nonetheless, with concomitant refractory ARDS, sepsis, ARF, and liver failure prognosis is likely dismal.  PLAN: - Cont supportive efforts. PCCM and I have spoken with the patient's father, will cap therapeutics at current level   Lisbeth Renshaw, MD [facility]

## 2020-12-15 NOTE — Progress Notes (Signed)
Patient's HR became 0 all of a sudden, asystole on the monitor. Nurses Dorthula Matas and Abby Bailiff auscultated no heart beat and pronounced the time of death at Aug 15, 2157. Primary MD made aware, family was also updated.

## 2020-12-15 NOTE — Progress Notes (Signed)
Inpatient Diabetes Program Recommendations  AACE/ADA: New Consensus Statement on Inpatient Glycemic Control (2015)  Target Ranges:  Prepandial:   less than 140 mg/dL      Peak postprandial:   less than 180 mg/dL (1-2 hours)      Critically ill patients:  140 - 180 mg/dL   Lab Results  Component Value Date   GLUCAP 104 (H) 2020/12/21   HGBA1C 5.3 11/21/2020    Review of Glycemic Control Results for Barry Cherry, Barry Cherry (MRN [id number]) as of 12-21-20 09:17 Results for Barry Cherry, Barry Cherry (MRN [id number]) as of 12/21/20 09:17  Ref. Range 11/22/2020 07:39 11/22/2020 11:44 11/22/2020 16:04 11/22/2020 19:51 12/21/20 00:22 12/21/2020 00:25 21-Dec-2020 03:46 12-21-20 04:24 2020-12-21 05:16 21-Dec-2020 08:05  Glucose-Capillary Latest Ref Range: 70 - 99 mg/dL 353 (H) 299 (H) 242 (H) 120 (H) 76 77 14 (LL) 151 (H) 126 (H) 104 (H)   Diabetes history: None Outpatient Diabetes medications: None Current orders for Inpatient glycemic control: Novolog 0-9 units Q4H  Inpatient Diabetes Program Recommendations:    Severe hypoglycemia this morning due to tube feeds being held at 11pm last night along with ARF.  Please consider:  Novolog 0-6 units Q6H  Novolog 3 units Q6H if tube feeds restarted (stop if feeds held or discontinued)  Will continue to follow while inpatient.  Thank you, Dulce Sellar, RN, BSN Diabetes Coordinator Inpatient Diabetes Program [phone] (team pager from 8a-5p)

## 2020-12-15 NOTE — Death Summary Note (Signed)
DEATH SUMMARY   Patient Details  Name: Barry Cherry. MRN: [id number] DOB: 04-01-1976  Admission/Discharge Information   Admit Date:  11/28/2020  Date of Death: Date of Death: Dec 02, 2020  Time of Death: Time of Death: 2157-08-25  Length of Stay: 4  Referring Physician: Evelene Croon, MD   Reason(s) for Hospitalization  Subaraachnoid Hemorrhage  Diagnoses  Preliminary cause of death:  Secondary Diagnoses (including complications and co-morbidities):  Active Problems:   Aneurysmal subarachnoid hemorrhage (HCC)   SAH (subarachnoid hemorrhage) (HCC)   Shock (HCC)   Acute renal failure (HCC)   Acute hypoxemic respiratory failure (HCC)   Pneumonia of both lungs due to infectious organism   Brief Hospital Course (including significant findings, care, treatment, and services provided and events leading to death)  Barry Cherry. is a 45 y.o. year old male who was initially admitted to the hospital after transfer from outside hospital.  He was intubated at the outside hospital for altered mental status and airway protection.  Work-up included CT scan demonstrating subarachnoid hemorrhage.  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  Patient underwent uncomplicated endovascular coil embolization of the aneurysm on his first hospital day.  Patient was monitored closely in the neuro intensive care unit however was not noted to have significant improvement in his neurologic exam.  He was also noted to have hypotension requiring vasopressor support.  His antibiotic coverage was broadened however he required escalating doses of vasopressors ultimately requiring maximum doses of multiple vasopressors to maintain normotension.  Continued observation revealed acute renal failure and ultimately also required continuous renal replacement therapy.  In addition, he was noted to require increasing ventilator support consistent with acute respiratory distress syndrome.  Underlying  etiology of his severe multiorgan system failure remains unclear, but unlikely to be related solely to his subarachnoid hemorrhage.  Nonetheless, given his rapid progressive decline and multiorgan system failure, it was not felt that his prognosis for recovery was good.  Discussion was had with the patient's family to not escalate therapeutics and change CODE STATUS to DNR.  Patient subsequently expired.  Pertinent Labs and Studies  Significant Diagnostic Studies CT ANGIO HEAD NECK W WO CM  Result Date: 11/28/2020 CLINICAL DATA:  Stroke/TIA, assess intracranial arteries. Intracranial hemorrhage. EXAM: CT ANGIOGRAPHY HEAD AND NECK TECHNIQUE: Multidetector CT imaging of the head and neck was performed using the standard protocol during bolus administration of intravenous contrast. Multiplanar CT image reconstructions and MIPs were obtained to evaluate the vascular anatomy. Carotid stenosis measurements (when applicable) are obtained utilizing NASCET criteria, using the distal internal carotid diameter as the denominator. CONTRAST:  75mL OMNIPAQUE IOHEXOL 350 MG/ML SOLN COMPARISON:  None. FINDINGS: CTA NECK FINDINGS Aortic arch: Standard 3 vessel aortic arch with widely patent arch vessel origins. Right carotid system: Patent and smooth without evidence of stenosis or dissection. Left carotid system: Patent and smooth without evidence of stenosis or dissection. Vertebral arteries: Patent and smooth without evidence of stenosis or dissection. Strongly dominant left vertebral artery. 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 Moderate disc degeneration at C5-6 and advanced disc degeneration at C6-7. Other neck: No evidence of cervical lymphadenopathy or mass. Upper chest: Clear lung apices. Review of the MIP images confirms the above findings CTA HEAD FINDINGS Anterior circulation: The internal carotid arteries are widely patent from skull base to carotid termini. There is a 3 mm aneurysm or infundibulum at the left  posterior communicating origin. ACAs and MCAs are patent without evidence of a proximal branch occlusion or significant proximal  stenosis. An irregular aneurysm projecting anteriorly and superiorly from the anterior communicating artery/right A1-A2 junction measures 5 x 5 x 4 mm. There is a median artery of the corpus callosum. Posterior circulation: The intracranial vertebral arteries are patent to the basilar with the right being diminutive distal to the PICA origin. The basilar artery is widely patent. Posterior communicating arteries are diminutive or absent. Both PCAs are patent without evidence of a significant proximal stenosis. No aneurysm is identified. Venous sinuses: Grossly patent superior sagittal, straight, and transverse sinuses. Limited assessment of the sigmoid sinuses due to contrast timing. Anatomic variants: None. Review of the MIP images confirms the above findings IMPRESSION: 1. 5 mm anterior communicating aneurysm. 2. 3 mm aneurysm or infundibulum at the left posterior communicating origin. 3. No large vessel occlusion or significant stenosis in the head or neck. Electronically Signed   By: Sebastian AcheAllen  Grady M.D.   On: 12/01/2020 15:43   CT HEAD WO CONTRAST (5MM)  Result Date: 11/21/2020 CLINICAL DATA:  Mental status change, neck trauma EXAM: CT HEAD WITHOUT CONTRAST CT CERVICAL SPINE WITHOUT CONTRAST TECHNIQUE: Multidetector CT imaging of the head and cervical spine was performed following the standard protocol without intravenous contrast. Multiplanar CT image reconstructions of the cervical spine were also generated. COMPARISON:  None. FINDINGS: CT HEAD FINDINGS Brain: No evidence of acute infarction, hydrocephalus, extra-axial collection or mass lesion/mass effect. There is extensive subarachnoid hemorrhage about the basal cisterns and extending about the lower cerebral hemispheres (series 2, image 11). There is an intraparenchymal hemorrhage centered about the carotid termini, measuring  approximately 2.5 x 2.4 x 2.3 cm in aggregate extending across the midline (series 4, image 21). There is significant extension of hemorrhage into the bilateral lateral ventricles as well as the third ventricle, with dilatation of the lateral ventricles and diffuse effacement of the cerebral sulci. Vascular: No hyperdense vessel or unexpected calcification. Skull: Normal. Negative for fracture or focal lesion. Sinuses/Orbits: No acute finding. Other: None. CT CERVICAL SPINE FINDINGS Alignment: Normal. Skull base and vertebrae: No acute fracture. No primary bone lesion or focal pathologic process. Soft tissues and spinal canal: No prevertebral fluid or swelling. No visible canal hematoma. Disc levels: Congenital fusion of C3-C4. Moderate disc space height loss and osteophytosis of C5 through C7. Upper chest: Negative. Other: None. IMPRESSION: 1. Extensive subarachnoid hemorrhage about the basal cisterns and extending about the lower cerebral hemispheres. 2. There is an intraparenchymal hemorrhage centered about the carotid termini, measuring approximately 2.5 x 2.4 x 2.3 cm in aggregate extending across the midline. 3. There is significant extension of hemorrhage into the bilateral lateral ventricles as well as the third ventricle, with dilatation of the lateral ventricles and diffuse effacement of the cerebral sulci. 4. Most likely etiology of this constellation of findings is a ruptured aneurysm of the anterior communicating artery, not directly visualized on this examination. 5. No fracture or static subluxation of the cervical spine. 6. Congenital fusion of C3-C4. Moderate disc space height loss and osteophytosis of C5 through C7. These results were called by telephone at the time of interpretation on 12/14/2020 at 3:20 pm to Dr. Tresa EndoKELLY [facility] , who verbally acknowledged these results. Electronically Signed   By: Lauralyn PrimesAlex  Bibbey M.D.   On: 11/24/2020 15:25   CT Cervical Spine Wo Contrast  Result Date:  11/18/2020 CLINICAL DATA:  Mental status change, neck trauma EXAM: CT HEAD WITHOUT CONTRAST CT CERVICAL SPINE WITHOUT CONTRAST TECHNIQUE: Multidetector CT imaging of the head and cervical spine was performed following the  standard protocol without intravenous contrast. Multiplanar CT image reconstructions of the cervical spine were also generated. COMPARISON:  None. FINDINGS: CT HEAD FINDINGS Brain: No evidence of acute infarction, hydrocephalus, extra-axial collection or mass lesion/mass effect. There is extensive subarachnoid hemorrhage about the basal cisterns and extending about the lower cerebral hemispheres (series 2, image 11). There is an intraparenchymal hemorrhage centered about the carotid termini, measuring approximately 2.5 x 2.4 x 2.3 cm in aggregate extending across the midline (series 4, image 21). There is significant extension of hemorrhage into the bilateral lateral ventricles as well as the third ventricle, with dilatation of the lateral ventricles and diffuse effacement of the cerebral sulci. Vascular: No hyperdense vessel or unexpected calcification. Skull: Normal. Negative for fracture or focal lesion. Sinuses/Orbits: No acute finding. Other: None. CT CERVICAL SPINE FINDINGS Alignment: Normal. Skull base and vertebrae: No acute fracture. No primary bone lesion or focal pathologic process. Soft tissues and spinal canal: No prevertebral fluid or swelling. No visible canal hematoma. Disc levels: Congenital fusion of C3-C4. Moderate disc space height loss and osteophytosis of C5 through C7. Upper chest: Negative. Other: None. IMPRESSION: 1. Extensive subarachnoid hemorrhage about the basal cisterns and extending about the lower cerebral hemispheres. 2. There is an intraparenchymal hemorrhage centered about the carotid termini, measuring approximately 2.5 x 2.4 x 2.3 cm in aggregate extending across the midline. 3. There is significant extension of hemorrhage into the bilateral lateral ventricles as  well as the third ventricle, with dilatation of the lateral ventricles and diffuse effacement of the cerebral sulci. 4. Most likely etiology of this constellation of findings is a ruptured aneurysm of the anterior communicating artery, not directly visualized on this examination. 5. No fracture or static subluxation of the cervical spine. 6. Congenital fusion of C3-C4. Moderate disc space height loss and osteophytosis of C5 through C7. These results were called by telephone at the time of interpretation on 12-10-2020 at 3:20 pm to Dr. Tresa Endo [facility] , who verbally acknowledged these results. Electronically Signed   By: Lauralyn Primes M.D.   On: December 10, 2020 15:25   IR Transcath/Emboliz  Result Date: 11/15/2020 PROCEDURE: DIAGNOSTIC CEREBRAL ANGIOGRAM COIL EMBOLIZATION OF ANTERIOR COMMUNICATING ARTERY ANEURYSM HISTORY: The patient is a 45 year old man presenting to the emergency department with altered mental status. Workup included CT scan demonstrating diffuse basal subarachnoid hemorrhage with intraparenchymal hemorrhage. Altered mental status progressed to require endotracheal intubation. His CT angiogram did demonstrate presence of an anterior communicating artery aneurysm as well as a possible aneurysm versus infundibulum at the left posterior communicating artery. Patient therefore presents for further workup with diagnostic cerebral angiogram and possible aneurysm embolization. ACCESS: The technical aspects of the procedure as well as its potential risks and benefits were reviewed with the patient's father. These risks included but were not limited to stroke, intracranial hemorrhage, bleeding, infection, allergic reaction, damage to organs or vital structures, stroke, non-diagnostic procedure, and the catastrophic outcomes of heart attack, coma, and death. With an understanding of these risks, informed consent was obtained and witnessed. The patient was placed in the supine position on the angiography table and  the skin of right groin prepped in the usual sterile fashion. The procedure was performed under general anesthesia. A 5-French sheath was introduced in the right common femoral artery using Seldinger technique. MEDICATIONS: HEPARIN: 0 Units total. CONTRAST:  cc, Omnipaque 300 FLUOROSCOPY TIME:  FLUOROSCOPY TIME: See IR records TECHNIQUE: CATHETERS AND WIRES 5-French JB-1 catheter 180 cm 0.035" glidewire 6-French NeuronMax guide sheath 6-French Merit Impress 125cm  vert catheter 0.058" CatV guidecatheter 150 cm Marksman microcatheter Synchro 2 select microwire Excelsior XT-17 microcatheter COILS USED Target XL 360 4 mm by 8mm Target XL 360 3 mm x 6 cm Target XL 360 2 mm by 6 cm VESSELS CATHETERIZED Right internal carotid Left internal carotid Left vertebral Right common femoral Right anterior cerebral artery VESSELS STUDIED Right internal carotid, head Left internal carotid, head Left vertebral Left internal carotid artery, head (during embolization) Left internal carotid artery, head (immediate post-embolization) Left internal carotid artery, head (final control) Right internal carotid artery, head (final control) Right common femoral PROCEDURAL NARRATIVE A 5-Fr JB-1 glide catheter was advanced over a 0.035 glidewire into the aortic arch. The above vessels were then sequentially catheterized and cervical / cerebral angiograms taken. After review of images, the catheter was removed without incident. The 5-Fr sheath was then exchanged over the glidewire for an 8-Fr sheath. Under real-time fluoroscopy, the guide sheath was advanced over the vert catheter and glidewire into the descending aorta. The vert catheter was then advanced into the right internal carotid artery. The guide sheath was then advanced into the mid cervical right internal carotid artery. The vert catheter was removed and the 058 guide catheter was coaxially introduced over the 027 microcatheter and microwire. The microcatheter was then advanced under  roadmap guidance into the right middle cerebral artery. This allowed advancement of the 58 guide catheter into the supraclinoid internal carotid artery. At this point the 27 microcatheter was removed and the coiling catheter was introduced over the microwire. The catheter was then navigated into the right A1 followed by the aneurysm lumen over the microwire. The wire was then removed. The above coils were then sequentially deployed with progressive exclusion of the aneurysm. Cerebral angiography was performed immediately post embolization. The coiling catheter was then removed and the guide catheter withdrawn into the cervical internal carotid artery. Final control angiography was then performed from the guide catheter. The vert catheter was then introduced after the guide sheath was withdrawn into the descending aorta. The left internal carotid artery was catheterized and final left-sided control angiogram was taken. The catheter and guide sheath were then synchronously removed without incident. FINDINGS: Right internal carotid, head: Injection reveals the presence of a widely patent ICA, M1, and A1 segments and their branches. There is an aneurysm arising from the anterior communicating artery. Aneurysm projects anteriorly and superiorly. This aneurysm measures approximately 3.9 x 4.5 mm. It appears to have a relatively narrow neck and arise from the anterior communicating artery proper. The parenchymal and venous phases are normal. The venous sinuses are widely patent. Left internal carotid, head: Injection reveals the presence of a widely patent ICA, A1, and M1 segments and their branches. There is an infundibulum at the origin of the left posterior communicating artery, which arises from the tip of this infundibulum. This does not appear to be aneurysmal. The parenchymal and venous phases are normal. The venous sinuses are widely patent. Left vertebral: Injection reveals the presence of a widely patent  vertebral artery. This leads to a widely patent basilar artery that terminates in bilateral P1. The basilar apex is normal. No aneurysms, AVMs, or high-flow fistulas are seen. The parenchymal and venous phases are normal. The venous sinuses are widely patent. Right internal carotid artery, head (during embolization): Injection reveals the presence of a widely patent ICA that leads to a patent ACA and MCA. Coil mass within the aneurysm is stable, without coil prolapse or filling defect to suggest thrombus. There  is flash filling of the contralateral A2 segment. Right internal carotid artery, head (immediate post-embolization): Injection reveals the presence of a widely patent ICA that leads to a patent ACA and MCA. Coil mass within the aneurysm is stable, without coil prolapse or filling defect to suggest thrombus. No aneurysm filling is seen. The contralateral A2 segment remains patent with flash filling. Right internal carotid artery, head (final control): Injection reveals the presence of a widely patent ICA that leads to a patent ACA and MCA. No thrombus is visualized. Coil mass is seen within the aneurysm and is in stable position. The parenchymal and venous phases are unremarkable. Left internal carotid artery, head (final control): Injection reveals presence of a widely patent left internal carotid with normal ICA bifurcation. The left anterior cerebral artery including distal segment is widely patent. Capillary phase does not demonstrate any perfusion deficits. Venous sinuses are unremarkable. Right femoral: Normal vessel. No significant atherosclerotic disease. Arterial sheath in adequate position in the proximal superficial femoral artery. Common femoral bifurcation appears to be relatively high in the femoral head. DISPOSITION: Upon completion of the study, the femoral sheath was removed and hemostasis obtained by manual compression. Good proximal and distal lower extremity pulses were documented upon  achievement of hemostasis. The procedure was well tolerated and no early complications were observed. The patient was transferred to the neuro intensive care unit in stable hemodynamic condition. IMPRESSION: 1. Successful coil embolization of an anterior communicating artery aneurysm. No residual aneurysm filling is seen post embolization. Distal anterior cerebral artery segments bilaterally remain widely patent. 2. Infundibulum at the origin of the left posterior communicating artery. No other intracranial aneurysms, arteriovenous malformations, or fistulas are seen. There is no intracranial vasospasm. The preliminary results of this procedure were shared with the patient's family. Electronically Signed   By: Lisbeth Renshaw   On: 11/16/2020 17:16   DG CHEST PORT 1 VIEW  Result Date: 12-06-2020 CLINICAL DATA:  45 year old male with respiratory distress. Subarachnoid hemorrhage, ruptured anterior communicating artery aneurysm. Shock. EXAM: PORTABLE CHEST 1 VIEW COMPARISON:  11/22/2020 portable chest and earlier. FINDINGS: Portable AP semi upright view at 0410 hours. Stable lines and tubes. Endotracheal tube tip between the clavicles and carina. Mildly larger lung volumes. Mediastinal contours remain within normal limits. Multifocal bilateral pulmonary consolidation, increased in the right upper lobe and about the left hilum since yesterday. No superimposed pneumothorax. No pleural effusion. Paucity of bowel gas in the upper abdomen. No acute osseous abnormality identified. IMPRESSION: 1. Further progression of bilateral pulmonary consolidation since yesterday. Consider ARDS, severe bilateral pneumonia. 2.  Stable lines and tubes. Electronically Signed   By: Odessa Fleming M.D.   On: 12-06-2020 04:58   DG CHEST PORT 1 VIEW  Result Date: 11/22/2020 CLINICAL DATA:  OG tube placement EXAM: PORTABLE CHEST 1 VIEW COMPARISON:  11/22/2020 FINDINGS: OG tube tip is in the fundus of the stomach. Feeding tube tip is in the  body of the stomach. Endotracheal tube, right internal jugular dialysis catheter and left subclavian central line are unchanged. Patchy bilateral airspace disease is worsened since prior study. Mild cardiomegaly. No visible significant effusions or pneumothorax. IMPRESSION: OG tube tip in the fundus of the stomach. Feeding tube tip in the body of the stomach. Worsening bilateral airspace disease. Electronically Signed   By: Charlett Nose M.D.   On: 11/22/2020 23:36   DG CHEST PORT 1 VIEW  Result Date: 11/22/2020 CLINICAL DATA:  Follow-up pneumonia, intubated EXAM: PORTABLE CHEST 1 VIEW COMPARISON:  11/22/2020,  2020/12/08, 05/28/2013 FINDINGS: Endotracheal tube tip is about 2.8 cm superior to the carina. Esophageal tube tip is below the diaphragm but incompletely visualized. Right-sided central venous catheter tip over the SVC. Left-sided central venous catheter tip over the SVC. Worsening bilateral pulmonary infiltrates. Stable cardiomediastinal silhouette. No pneumothorax IMPRESSION: 1. Support lines and tubes as above. New right IJ central venous catheter tip over the SVC 2. Worsening bilateral airspace disease suspicious for pneumonia Electronically Signed   By: Jasmine Pang M.D.   On: 11/22/2020 22:14   DG CHEST PORT 1 VIEW  Result Date: 11/22/2020 CLINICAL DATA:  Pneumonia. EXAM: PORTABLE CHEST 1 VIEW COMPARISON:  Radiograph earlier today. FINDINGS: Endotracheal tube, enteric tube, and left subclavian central line remain in place. Lower lung volumes from earlier today. Increasing heterogeneous opacity at the right lung base with possible small right pleural effusion and fluid in the fissure. There is mild patchy opacity in the left retrocardiac region. No pneumothorax. Stable heart size and mediastinal contours. There is increasing gaseous gastric distention in the upper abdomen. IMPRESSION: 1. Increasing heterogeneous opacity at the right lung base with possible small right pleural effusion and fluid in  the fissure. 2. Patchy opacity in the left retrocardiac region, atelectasis versus pneumonia. 3. Stable support apparatus. 4. Increasing gaseous gastric distention in the upper abdomen. Electronically Signed   By: Narda Rutherford M.D.   On: 11/22/2020 17:07   DG CHEST PORT 1 VIEW  Result Date: 11/22/2020 CLINICAL DATA:  Check central line placement EXAM: PORTABLE CHEST 1 VIEW COMPARISON:  2020/12/08 FINDINGS: Endotracheal tube and feeding catheter are noted in satisfactory position. New left subclavian central line is seen in the mid superior vena cava. No pneumothorax is noted. Increasing right basilar airspace opacity is noted medially consistent with early infiltrate. No bony abnormality is noted. IMPRESSION: No pneumothorax following central line placement. Early infiltrate in the medial right lung base. Electronically Signed   By: Alcide Clever M.D.   On: 11/22/2020 02:08   DG Chest Portable 1 View  Result Date: 12/08/20 CLINICAL DATA:  Endotracheal tube placement EXAM: PORTABLE CHEST 1 VIEW COMPARISON:  05/28/2013 FINDINGS: The heart size and mediastinal contours are within normal limits. Endotracheal tube is positioned with tip above the carina. Esophagogastric tube with tip and side port below the diaphragm. Both lungs are clear. The visualized skeletal structures are unremarkable. IMPRESSION: 1. Endotracheal tube is positioned with tip above the carina. 2. Esophagogastric tube with tip and side port below the diaphragm. 3. No acute abnormality of the lungs. Electronically Signed   By: Lauralyn Primes M.D.   On: 2020-12-08 16:54   DG Abd Portable 1V  Result Date: 11/21/2020 CLINICAL DATA:  Feeding tube placement EXAM: PORTABLE ABDOMEN - 1 VIEW COMPARISON:  07/26/2009 FINDINGS: Limited radiograph of the lower chest and upper abdomen was obtained for the purposes of enteric tube localization. Large-bore enteric tube is seen coursing below the diaphragm with distal tip terminating within the expected  location of the gastric body. IMPRESSION: Large-bore enteric tube with distal tip terminating within the expected location of the gastric body. Electronically Signed   By: Duanne Guess D.O.   On: 11/21/2020 14:21   ECHOCARDIOGRAM COMPLETE  Result Date: 11/14/2020    ECHOCARDIOGRAM REPORT   Patient Name:   Ingvald Theisen. Date of Exam: 12/02/2020 Medical Rec #:  [id number]            Height:       63.0 in Accession #:  [id number]           Weight:       125.0 lb Date of Birth:  Oct 24, 1975           BSA:          1.584 m Patient Age:    44 years             BP:           109/62 mmHg Patient Gender: M                    HR:           94 bpm. Exam Location:  Inpatient Procedure: 2D Echo, Cardiac Doppler and Color Doppler Indications:    Stroke I63.9  History:        Patient has no prior history of Echocardiogram examinations.                 Signs/Symptoms:Altered Mental Status. Brain bleed.  Sonographer:    Roosvelt Maser RDCS Referring Phys: [id number] Maisie Fus A OSTERGARD IMPRESSIONS  1. Left ventricular ejection fraction, by estimation, is >75%. The left ventricle has hyperdynamic function. The left ventricle has no regional wall motion abnormalities. There is mild eccentric left ventricular hypertrophy of the posterior-lateral segment. Left ventricular diastolic parameters are consistent with Grade I diastolic dysfunction (impaired relaxation).  2. Right ventricular systolic function is normal. The right ventricular size is normal. Tricuspid regurgitation signal is inadequate for assessing PA pressure.  3. A small pericardial effusion is present. The pericardial effusion is posterior to the left ventricle.  4. The mitral valve is normal in structure. No evidence of mitral valve regurgitation. No evidence of mitral stenosis.  5. The aortic valve is grossly normal. Aortic valve regurgitation is not visualized. No aortic stenosis is present.  6. The inferior vena cava is normal in size with greater than 50%  respiratory variability, suggesting right atrial pressure of 3 mmHg. Conclusion(s)/Recommendation(s): No intracardiac source of embolism detected on this transthoracic study. A transesophageal echocardiogram can be considered to exclude cardiac source of embolism if clinically indicated. FINDINGS  Left Ventricle: Left ventricular ejection fraction, by estimation, is >75%. The left ventricle has hyperdynamic function. The left ventricle has no regional wall motion abnormalities. The left ventricular internal cavity size was normal in size. There is mild eccentric left ventricular hypertrophy of the posterior-lateral segment. Left ventricular diastolic parameters are consistent with Grade I diastolic dysfunction (impaired relaxation). Right Ventricle: The right ventricular size is normal. No increase in right ventricular wall thickness. Right ventricular systolic function is normal. Tricuspid regurgitation signal is inadequate for assessing PA pressure. Left Atrium: Left atrial size was normal in size. Right Atrium: Right atrial size was normal in size. Pericardium: A small pericardial effusion is present. The pericardial effusion is posterior to the left ventricle. Mitral Valve: The mitral valve is normal in structure. No evidence of mitral valve regurgitation. No evidence of mitral valve stenosis. Tricuspid Valve: The tricuspid valve is normal in structure. Tricuspid valve regurgitation is not demonstrated. No evidence of tricuspid stenosis. Aortic Valve: The aortic valve is grossly normal. Aortic valve regurgitation is not visualized. No aortic stenosis is present. Pulmonic Valve: The pulmonic valve was not well visualized. Pulmonic valve regurgitation is not visualized. No evidence of pulmonic stenosis. Aorta: The aortic root is normal in size and structure. Venous: The inferior vena cava is normal in size with greater than 50% respiratory variability, suggesting right atrial pressure of 3 mmHg.  IAS/Shunts: No  atrial level shunt detected by color flow Doppler.  LEFT VENTRICLE PLAX 2D LVIDd:         2.70 cm  Diastology LVIDs:         1.50 cm  LV e' medial:    5.33 cm/s LV PW:         1.20 cm  LV E/e' medial:  16.3 LV IVS:        0.80 cm  LV e' lateral:   5.11 cm/s LVOT diam:     1.90 cm  LV E/e' lateral: 17.0 LVOT Area:     2.84 cm  LEFT ATRIUM         Index LA diam:    2.60 cm 1.64 cm/m   AORTA Ao Root diam: 3.10 cm MITRAL VALVE MV Area (PHT): 3.68 cm     SHUNTS MV Decel Time: 206 msec     Systemic Diam: 1.90 cm MV E velocity: 87.00 cm/s MV A velocity: 108.00 cm/s MV E/A ratio:  0.81 Weston Brass MD Electronically signed by Weston Brass MD Signature Date/Time: 11/22/2020/3:55:10 PM    Final    VAS [place] TRANSCRANIAL DOPPLER  Result Date: 11/22/2020  Transcranial Doppler Patient Name:  Mayes Sangiovanni.  Date of Exam:   12/07/2020 Medical Rec #: [id number]             Accession #:    [id number] Date of Birth: 1975-10-11            Patient Gender: M Patient Age:   89 years Exam Location:  [facility] Procedure:      VAS [place] TRANSCRANIAL DOPPLER Referring Phys: Autumn Patty --------------------------------------------------------------------------------  Indications: Subarachnoid hemorrhage. Comparison Study: 11/21/20 TCD Performing Technologist: Gertie Fey MHA, RDMS, RVT, RDCS  Examination Guidelines: A complete evaluation includes B-mode imaging, spectral Doppler, color Doppler, and power Doppler as needed of all accessible portions of each vessel. Bilateral testing is considered an integral part of a complete examination. Limited examinations for reoccurring indications may be performed as noted.  +----------+-------------+----------+-----------+-------+ RIGHT TCD Right VM (cm)Depth (cm)PulsatilityComment +----------+-------------+----------+-----------+-------+ MCA           64.00                 1.80            +----------+-------------+----------+-----------+-------+ ACA           -41.00                 1.49            +----------+-------------+----------+-----------+-------+ Term ICA      39.00                 1.68            +----------+-------------+----------+-----------+-------+ PCA           37.00                 1.46            +----------+-------------+----------+-----------+-------+ Opthalmic     17.00                 1.50            +----------+-------------+----------+-----------+-------+ ICA siphon    55.00                 1.48            +----------+-------------+----------+-----------+-------+ Vertebral    -26.00  1.04            +----------+-------------+----------+-----------+-------+ Distal ICA    42.00                                 +----------+-------------+----------+-----------+-------+  +----------+------------+----------+-----------+-------+ LEFT TCD  Left VM (cm)Depth (cm)PulsatilityComment +----------+------------+----------+-----------+-------+ MCA          76.00                 1.71            +----------+------------+----------+-----------+-------+ ACA          -36.00                1.57            +----------+------------+----------+-----------+-------+ Term ICA     57.00                 1.60            +----------+------------+----------+-----------+-------+ PCA          14.00                 1.73            +----------+------------+----------+-----------+-------+ Opthalmic    14.00                 1.20            +----------+------------+----------+-----------+-------+ ICA siphon   57.00                 1.33            +----------+------------+----------+-----------+-------+ Vertebral    -29.00                1.10            +----------+------------+----------+-----------+-------+ Distal ICA   37.00                                 +----------+------------+----------+-----------+-------+  +------------+-----+------------------+             VM cm      Comment       +------------+-----+------------------+ Prox Basilar     Unable to insonate +------------+-----+------------------+ Dist Basilar     Unable to insonate +------------+-----+------------------+ +----------------------+----+ Right Lindegaard Ratio1.52 +----------------------+----+ +---------------------+----+ Left Lindegaard Ratio2.05 +---------------------+----+    Preliminary    VAS [place] TRANSCRANIAL DOPPLER  Result Date: 11/21/2020  Transcranial Doppler Patient Name:  Josemaria Brining.  Date of Exam:   11/21/2020 Medical Rec #: [id number]             Accession #:    [id number] Date of Birth: 03-03-1976            Patient Gender: M Patient Age:   78 years Exam Location:  [facility] Procedure:      VAS [place] TRANSCRANIAL DOPPLER Referring Phys: Autumn Patty --------------------------------------------------------------------------------  Indications: Subarachnoid hemorrhage. 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 Limitations for diagnostic windows: Unable to insonate left orbital window. Comparison Study: No prior study Performing Technologist: Sherren Kerns RVS  Examination Guidelines: A complete evaluation includes B-mode imaging, spectral Doppler, color Doppler, and power Doppler as needed of all accessible portions of each vessel. Bilateral testing is considered an integral part of a complete examination. Limited examinations for reoccurring indications may be performed as noted.  +----------+-------------+----------+-----------+---------------------------+  RIGHT TCD Right VM (cm)Depth (cm)Pulsatility          Comment           +----------+-------------+----------+-----------+---------------------------+ MCA           58.00                 1.90                                +----------+-------------+----------+-----------+---------------------------+ ACA          -17.00                  1.35                                +----------+-------------+----------+-----------+---------------------------+ Term ICA      45.00                 1.65                                +----------+-------------+----------+-----------+---------------------------+ PCA           35.00                 1.55                                +----------+-------------+----------+-----------+---------------------------+ Opthalmic     25.00                 1.52                                +----------+-------------+----------+-----------+---------------------------+ ICA siphon                                  pt could not tolerate probe +----------+-------------+----------+-----------+---------------------------+ Vertebral                                           vent collar         +----------+-------------+----------+-----------+---------------------------+ Distal ICA    30.00                 1.57                                +----------+-------------+----------+-----------+---------------------------+  +----------+------------+----------+-----------+---------------------------+ LEFT TCD  Left VM (cm)Depth (cm)Pulsatility          Comment           +----------+------------+----------+-----------+---------------------------+ MCA          62.00                 1.74                                +----------+------------+----------+-----------+---------------------------+ ACA          -24.00                2.46                                +----------+------------+----------+-----------+---------------------------+  Term ICA     38.00                 1.32                                +----------+------------+----------+-----------+---------------------------+ PCA          21.00                 1.66                                +----------+------------+----------+-----------+---------------------------+ Opthalmic                                   pt could not tolerate probe +----------+------------+----------+-----------+---------------------------+ ICA siphon                                 pt could not tolerate probe +----------+------------+----------+-----------+---------------------------+ Vertebral                                          vent collar         +----------+------------+----------+-----------+---------------------------+ Distal ICA   31.00       1.45                                          +----------+------------+----------+-----------+---------------------------+  +------------+-----+-----------+             VM cm  Comment   +------------+-----+-----------+ Prox Basilar     vent collar +------------+-----+-----------+ +----------------------+---+ Right Lindegaard Ratio1.9 +----------------------+---+ +---------------------+---+ Left Lindegaard Ratio2.0 +---------------------+---+    Preliminary     Microbiology Recent Results (from the past 240 hour(s))  MRSA Next Gen by PCR, Nasal     Status: None   Collection Time: Nov 30, 2020  6:44 PM   Specimen: Nasal Mucosa; Nasal Swab  Result Value Ref Range Status   MRSA by PCR Next Gen NOT DETECTED NOT DETECTED Final    Comment: (NOTE) The GeneXpert MRSA Assay (FDA approved for NASAL specimens only), is one component of a comprehensive MRSA colonization surveillance program. It is not intended to diagnose MRSA infection nor to guide or monitor treatment for MRSA infections. Test performance is not FDA approved in patients less than 54 years old. Performed at [facility], [address], [place], [place] [address]   Surgical PCR screen     Status: Abnormal   Collection Time: 12/02/2020  1:52 PM   Specimen: Nasal Mucosa; Nasal Swab  Result Value Ref Range Status   MRSA, PCR NEGATIVE NEGATIVE Final   Staphylococcus aureus POSITIVE (A) NEGATIVE Final    Comment: (NOTE) The Xpert SA Assay (FDA approved for NASAL specimens in  patients 41 years of age and older), is one component of a comprehensive surveillance program. It is not intended to diagnose infection nor to guide or monitor treatment. Performed at [facility], [address], [place], [place] [address]   SARS CORONAVIRUS 2 (TAT 6-24 HRS) Nasopharyngeal Nasopharyngeal Swab     Status: None   Collection Time: 11/22/2020  2:05 PM   Specimen: Nasopharyngeal Swab  Result Value Ref Range Status   SARS Coronavirus 2 NEGATIVE NEGATIVE Final    Comment: (NOTE) SARS-CoV-2 target nucleic acids are NOT DETECTED.  The SARS-CoV-2 RNA is generally detectable in upper and lower respiratory specimens during the acute phase of infection. Negative results do not preclude SARS-CoV-2 infection, do not rule out co-infections with other pathogens, and should not be used as the sole basis for treatment or other patient management decisions. Negative results must be combined with clinical observations, patient history, and epidemiological information. The expected result is Negative.  Fact Sheet for Patients: HairSlick.no  Fact Sheet for Healthcare Providers: quierodirigir.com  This test is not yet approved or cleared by the [place] FDA and  has been authorized for detection and/or diagnosis of SARS-CoV-2 by FDA under an Emergency Use Authorization (EUA). This EUA will remain  in effect (meaning this test can be used) for the duration of the COVID-19 declaration under Se ction 564(b)(1) of the Act, 21 U.S.C. section 360bbb-3(b)(1), unless the authorization is terminated or revoked sooner.  Performed at [facility], [address] [place], [place] [address]   Culture, Respiratory w Gram Stain     Status: None (Preliminary result)   Collection Time: 11/21/20 11:01 AM   Specimen: Tracheal Aspirate; Respiratory  Result Value Ref Range Status   Specimen Description TRACHEAL ASPIRATE  Final    Special Requests NONE  Final   Gram Stain   Final    ABUNDANT WBC PRESENT,BOTH PMN AND MONONUCLEAR ABUNDANT GRAM NEGATIVE RODS MODERATE GRAM POSITIVE COCCI FEW GRAM VARIABLE ROD    Culture   Final    ABUNDANT STAPHYLOCOCCUS AUREUS ABUNDANT STREPTOCOCCUS PNEUMONIAE SUSCEPTIBILITIES TO FOLLOW Performed at [facility], [address], [place], [place] [address]    Report Status PENDING  Incomplete   Organism ID, Bacteria STREPTOCOCCUS PNEUMONIAE  Final      Susceptibility   Streptococcus pneumoniae - MIC*    ERYTHROMYCIN <=0.12 SENSITIVE Sensitive     LEVOFLOXACIN 1 SENSITIVE Sensitive     VANCOMYCIN 0.5 SENSITIVE Sensitive     PENICILLIN (meningitis) <=0.06 SENSITIVE Sensitive     PENO - penicillin <=0.06      PENICILLIN (non-meningitis) <=0.06 SENSITIVE Sensitive     PENICILLIN (oral) <=0.06 SENSITIVE Sensitive     CEFTRIAXONE (non-meningitis) <=0.12 SENSITIVE Sensitive     CEFTRIAXONE (meningitis) <=0.12 SENSITIVE Sensitive     * ABUNDANT STREPTOCOCCUS PNEUMONIAE    Lab Basic Metabolic Panel: Recent Labs  Lab 11/21/20 0657 11/22/20 0554 11/22/20 1514 11/22/20 1525 11/15/2020 0406 12/06/2020 0440 11/22/2020 0517 12/04/2020 0700 12/08/2020 0756 12/08/2020 1700  NA 144  --  145   < > 152* 155* 154* 153* 153* 151*  K 4.1  --  4.3   < > 4.7 4.1 3.9 3.9 3.9 5.4*  CL 114*  --  121*  --  117*  --  114*  --   --  110  CO2 20*  --  18*  --  12*  --  12*  --   --  9*  GLUCOSE 116*  --  238*  --  <20*  --  132*  --   --  77  BUN 27*  --  51*  --  47*  --  42*  --   --  37*  CREATININE 1.31*  --  2.11*  --  3.03*  --  2.93*  --   --  3.31*  CALCIUM 7.5*  --  7.5*  --  7.1*  --  6.7*  --   --  6.3*  MG 1.9 2.3 2.5*  --   --   --  2.6*  --   --  3.0*  PHOS 4.8* 3.2 2.8  --   --   --  9.3*  9.2*  --   --  >30.0*   < > = values in this interval not displayed.   Liver Function Tests: Recent Labs  Lab 12/11/2020 1420 11/21/20 0657 11/29/2020 0406 12/03/2020 0517 11/22/2020 1700  AST  97* 88* 1,013*  --   --   ALT 102* 70* 598*  --   --   ALKPHOS 126 88 106  --   --   BILITOT 1.5* 2.0* 2.0*  --   --   PROT 7.8 5.8* 4.6*  --   --   ALBUMIN 3.7 2.5* 2.0* 1.9* 1.4*   No results for input(s): LIPASE, AMYLASE in the last 168 hours. No results for input(s): AMMONIA in the last 168 hours. CBC: Recent Labs  Lab 12/11/2020 1420 12-11-20 2000 11/21/20 0657 11/22/20 1514 11/22/20 1525 12/05/2020 0406 11/25/2020 0440 11/29/2020 0517 11/22/2020 0700 11/30/2020 0756  WBC 21.0*  --  22.5* 18.4*  --  13.6*  --  14.1*  --   --   NEUTROABS 16.1*  --   --   --   --   --   --   --   --   --   HGB 16.8   < > 14.6 12.6*   < > 12.8* 11.6* 12.4* 13.3 13.6  HCT 47.6   < > 44.0 38.3*   < > 42.6 34.0* 40.8 39.0 40.0  MCV 95.6  --  99.1 98.5  --  107.3*  --  107.1*  --   --   PLT 188  --  145* 183  --  109*  --  82*  --   --    < > = values in this interval not displayed.   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

## 2020-12-15 NOTE — Progress Notes (Signed)
Transcranial Doppler  Date POD PCO2 HCT BP  MCA ACA PCA OPHT SIPH VERT Basilar  8/8 CK     Right  Left   58  62   -17  -24   35  21   25  *   *  *   *  *   *      8/10 MS     Right  Left   64  76   -41  -36   37  14   17  14    55  57   -26  -29   *  *         Right  Left                                             Right  Left                                             Right  Left                                            Right  Left                                            Right  Left                                       *unable to insonate  MCA = Middle Cerebral Artery      OPHT = Opthalmic Artery     BASILAR = Basilar Artery   ACA = Anterior Cerebral Artery     SIPH = Carotid Siphon PCA = Posterior Cerebral Artery   VERT = Verterbral Artery                   Normal MCA = 62+\-12 ACA = 50+\-12 PCA = 42+\-23   Right Lindegard Ratio: 1.52 Left Lindegard Ratio: 2.05  12/14/2020 11:01 AM 01/23/2021., MHA, RVT, RDCS, RDMS

## 2020-12-15 DEATH — deceased

## 2020-12-20 MED FILL — Medication: Qty: 1 | Status: AC

## 2022-12-05 IMAGING — DX DG CHEST 1V PORT
1 series · 1 of 1 positions shown · non-contrast
Comparison: Radiograph earlier today.

CLINICAL DATA: Pneumonia.

EXAM:
PORTABLE CHEST 1 VIEW

[chest ap]
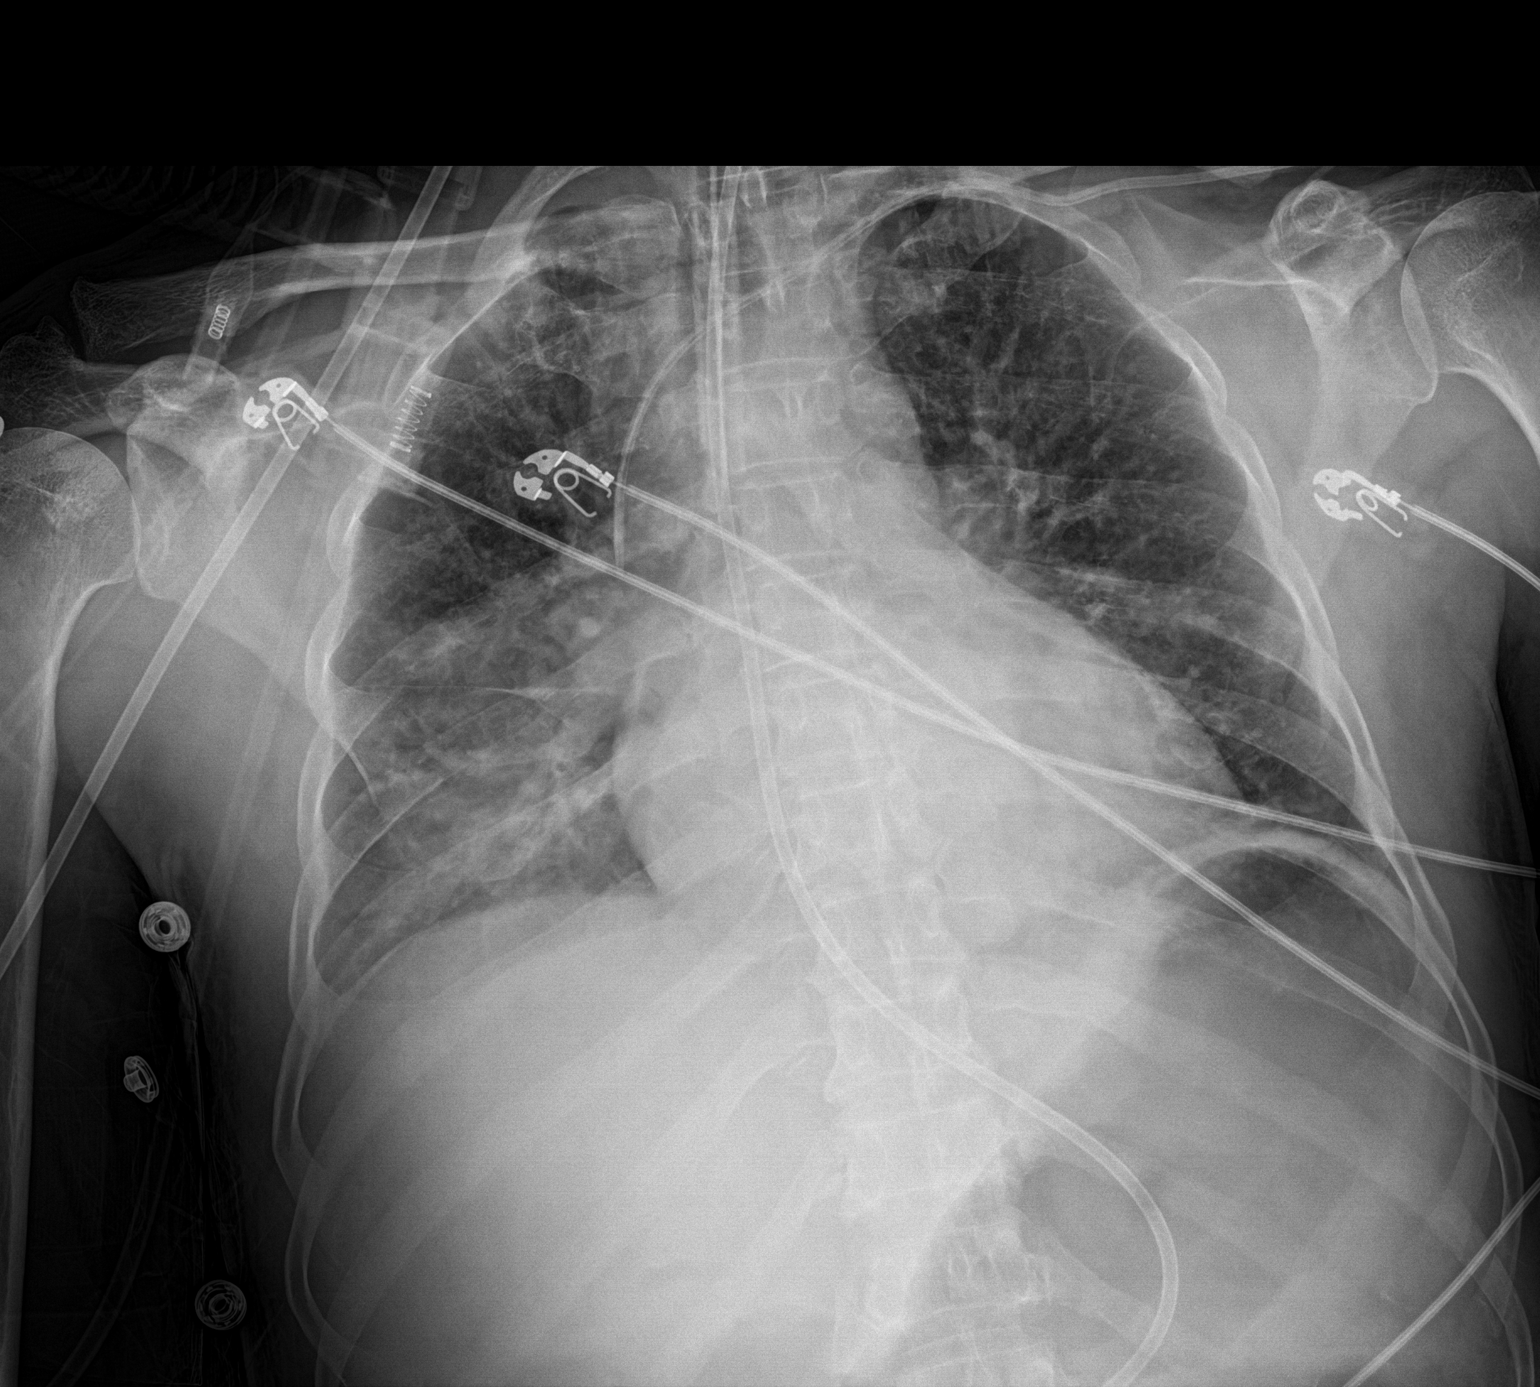

[1 of 1 positions shown; findings below may reference images not displayed]

FINDINGS: Endotracheal tube, enteric tube, and left subclavian central line
remain in place. Lower lung volumes from earlier today. Increasing
heterogeneous opacity at the right lung base with possible small
right pleural effusion and fluid in the fissure. There is mild
patchy opacity in the left retrocardiac region. No pneumothorax.
Stable heart size and mediastinal contours. There is increasing
gaseous gastric distention in the upper abdomen.
IMPRESSION: 1. Increasing heterogeneous opacity at the right lung base with
possible small right pleural effusion and fluid in the fissure.
2. Patchy opacity in the left retrocardiac region, atelectasis
versus pneumonia.
3. Stable support apparatus.
4. Increasing gaseous gastric distention in the upper abdomen.

## 2022-12-05 IMAGING — DX DG CHEST 1V PORT
1 series · 1 of 1 positions shown · non-contrast
Comparison: 11/22/2020

CLINICAL DATA: OG tube placement

EXAM:
PORTABLE CHEST 1 VIEW

[chest]
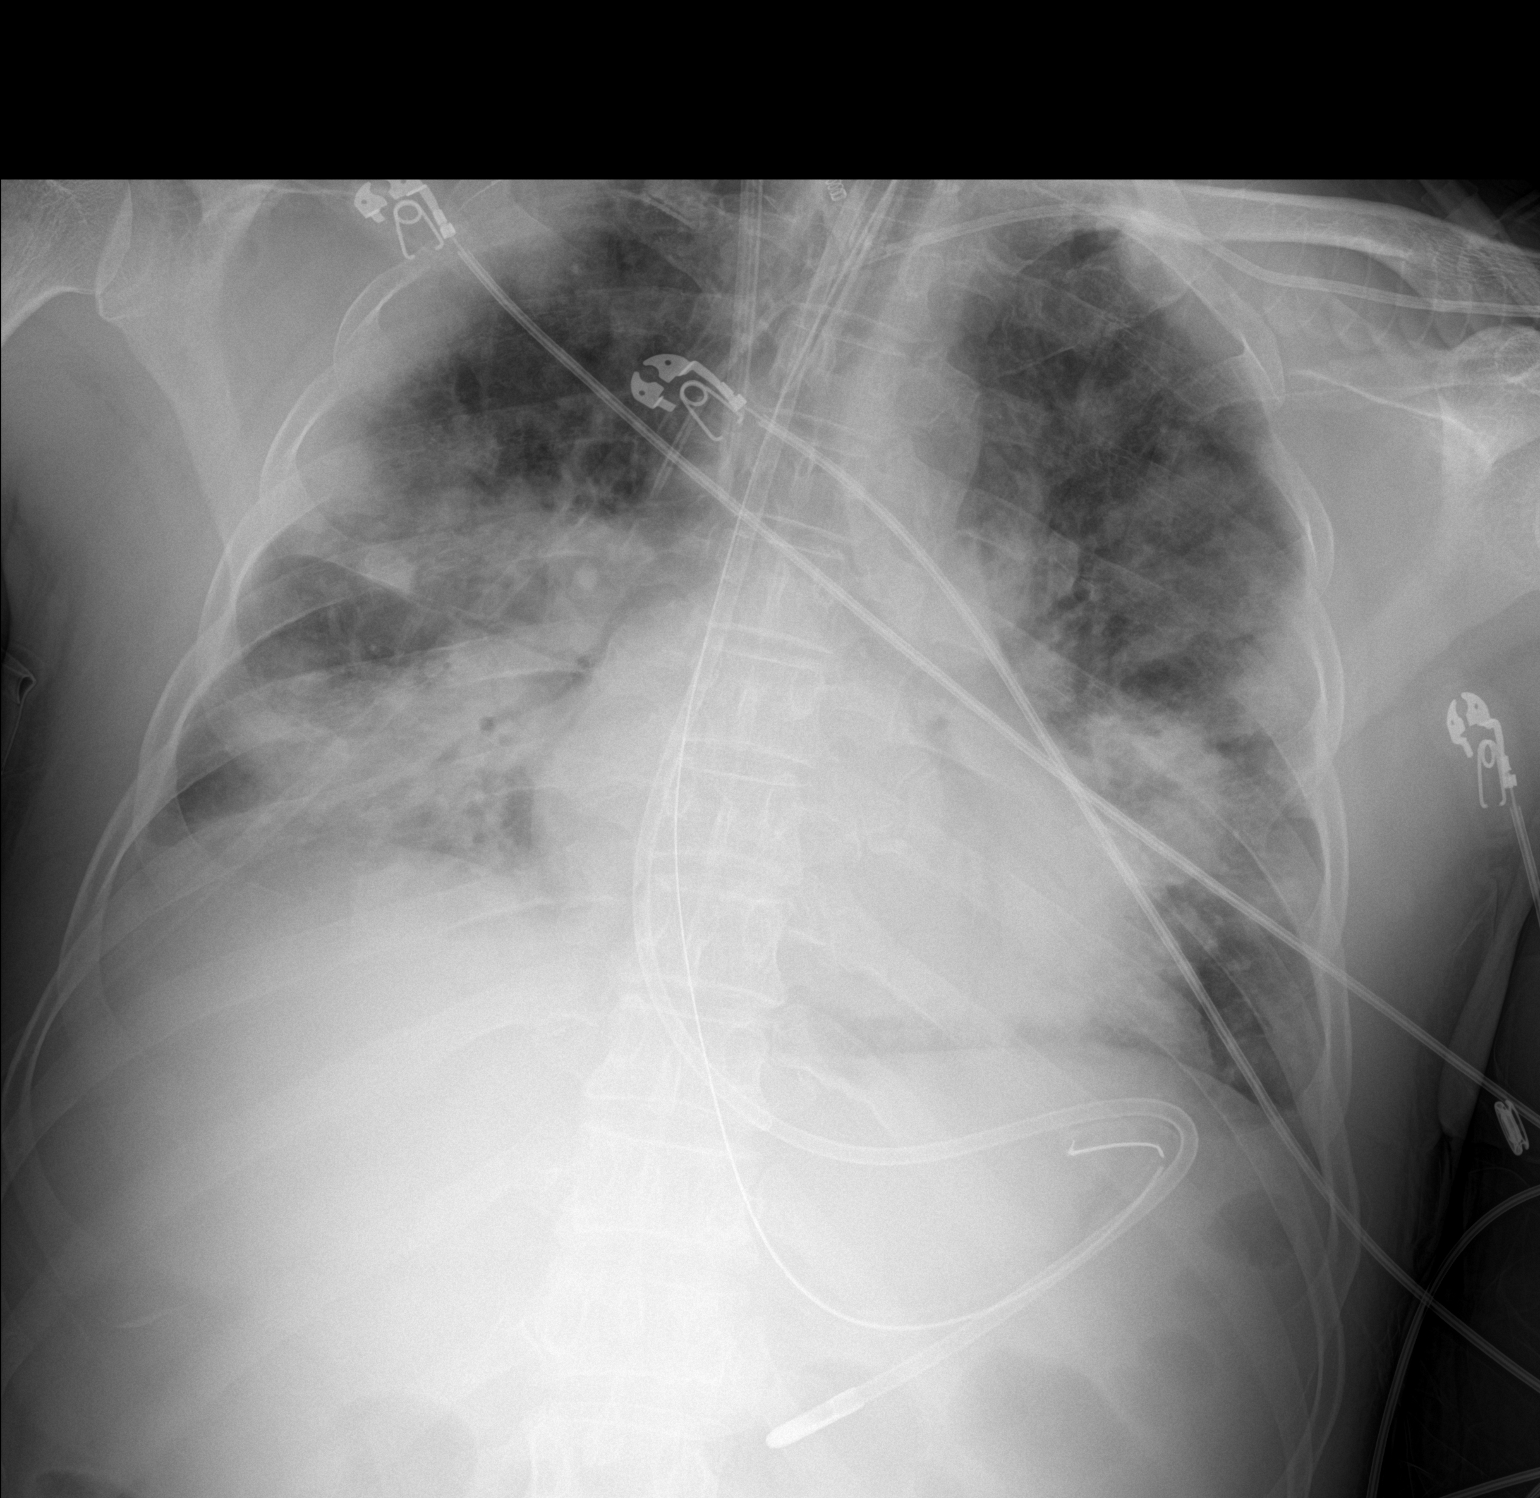

[1 of 1 positions shown; findings below may reference images not displayed]

FINDINGS: OG tube tip is in the fundus of the stomach. Feeding tube tip is in
the body of the stomach. Endotracheal tube, right internal jugular
dialysis catheter and left subclavian central line are unchanged.
Patchy bilateral airspace disease is worsened since prior study.
Mild cardiomegaly. No visible significant effusions or pneumothorax.
IMPRESSION: OG tube tip in the fundus of the stomach. Feeding tube tip in the
body of the stomach.

Worsening bilateral airspace disease.

## 2022-12-05 IMAGING — DX DG CHEST 1V PORT
1 series · 1 of 1 positions shown · non-contrast
Comparison: 11/22/2020, 11/19/2020, 05/28/2013

CLINICAL DATA: Follow-up pneumonia, intubated

EXAM:
PORTABLE CHEST 1 VIEW

[chest]
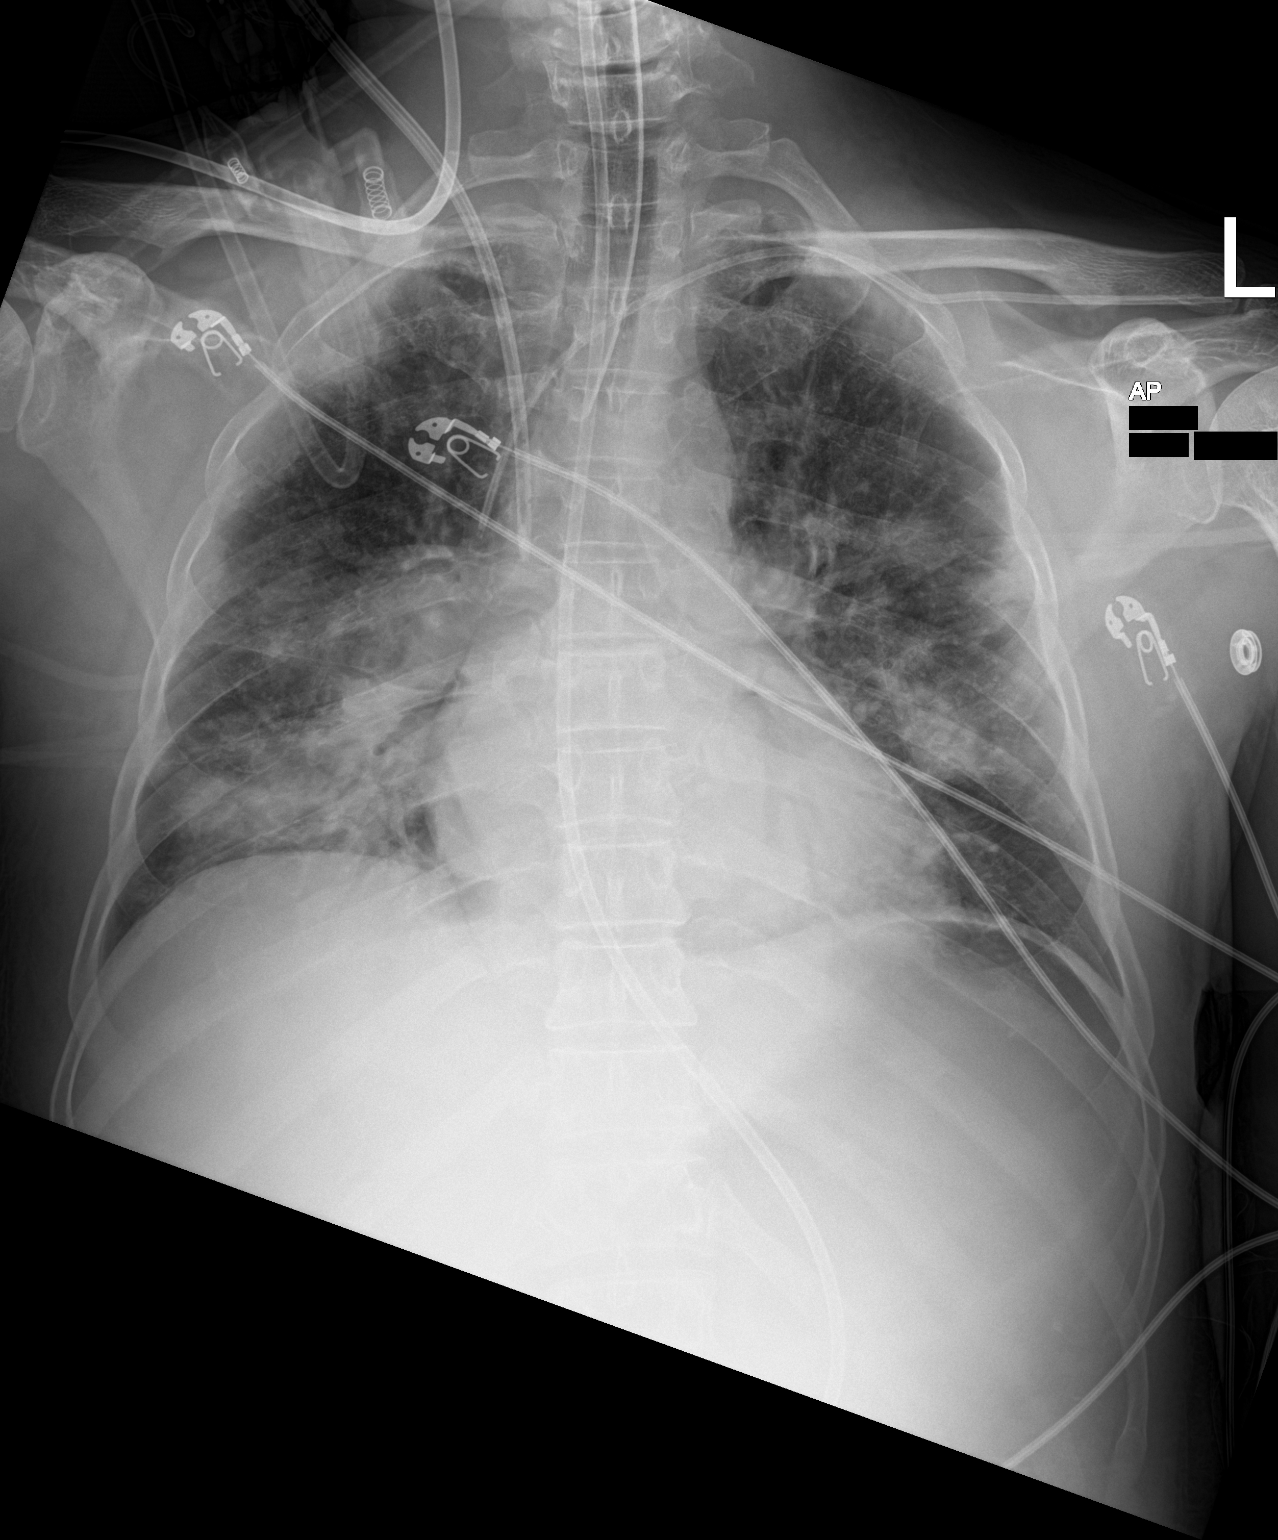

[1 of 1 positions shown; findings below may reference images not displayed]

FINDINGS: Endotracheal tube tip is about 2.8 cm superior to the carina.
Esophageal tube tip is below the diaphragm but incompletely
visualized. Right-sided central venous catheter tip over the SVC.
Left-sided central venous catheter tip over the SVC. Worsening
bilateral pulmonary infiltrates. Stable cardiomediastinal
silhouette. No pneumothorax
IMPRESSION: 1. Support lines and tubes as above. New right IJ central venous
catheter tip over the SVC
2. Worsening bilateral airspace disease suspicious for pneumonia
